# Patient Record
Sex: Female | Born: 1935 | Race: White | Hispanic: No | Marital: Married | Smoking: Never smoker
Health system: Southern US, Community
[De-identification: ages and names within clinical notes are randomized; demographics above are authoritative.]

---

## 2003-11-25 ENCOUNTER — Other Ambulatory Visit: Payer: Self-pay

## 2003-11-26 ENCOUNTER — Other Ambulatory Visit: Payer: Self-pay

## 2004-07-24 ENCOUNTER — Ambulatory Visit: Admission: RE | Admit: 2004-07-24 | Discharge: 2004-07-24 | Payer: Self-pay | Admitting: Neurosurgery

## 2004-08-04 ENCOUNTER — Inpatient Hospital Stay (HOSPITAL_COMMUNITY): Admission: RE | Admit: 2004-08-04 | Discharge: 2004-08-06 | Payer: Self-pay | Admitting: Neurosurgery

## 2005-10-01 ENCOUNTER — Other Ambulatory Visit: Payer: Self-pay

## 2005-10-01 ENCOUNTER — Emergency Department: Payer: Self-pay | Admitting: Emergency Medicine

## 2011-05-12 ENCOUNTER — Ambulatory Visit: Payer: Self-pay | Admitting: Internal Medicine

## 2012-08-03 ENCOUNTER — Ambulatory Visit: Payer: Self-pay | Admitting: Internal Medicine

## 2012-08-03 ENCOUNTER — Inpatient Hospital Stay: Payer: Self-pay | Admitting: Internal Medicine

## 2012-08-03 LAB — CBC
HCT: 35.3 % (ref 35.0–47.0)
HGB: 11.9 g/dL — ABNORMAL LOW (ref 12.0–16.0)
MCV: 89 fL (ref 80–100)
WBC: 10.5 10*3/uL (ref 3.6–11.0)

## 2012-08-03 LAB — CK TOTAL AND CKMB (NOT AT ARMC)
CK, Total: 113 U/L (ref 21–215)
CK-MB: 1.1 ng/mL (ref 0.5–3.6)
CK-MB: 2 ng/mL (ref 0.5–3.6)

## 2012-08-03 LAB — URINALYSIS, COMPLETE
Nitrite: POSITIVE
Protein: 30
Specific Gravity: 1.02 (ref 1.003–1.030)
WBC UR: 24 /HPF (ref 0–5)

## 2012-08-03 LAB — BASIC METABOLIC PANEL
Anion Gap: 9 (ref 7–16)
Calcium, Total: 8.3 mg/dL — ABNORMAL LOW (ref 8.5–10.1)
Glucose: 115 mg/dL — ABNORMAL HIGH (ref 65–99)
Osmolality: 283 (ref 275–301)
Sodium: 141 mmol/L (ref 136–145)

## 2012-08-04 LAB — BASIC METABOLIC PANEL
BUN: 12 mg/dL (ref 7–18)
Chloride: 106 mmol/L (ref 98–107)
Co2: 27 mmol/L (ref 21–32)
Creatinine: 1.07 mg/dL (ref 0.60–1.30)
EGFR (African American): 53 — ABNORMAL LOW
Glucose: 124 mg/dL — ABNORMAL HIGH (ref 65–99)

## 2012-08-04 LAB — CBC WITH DIFFERENTIAL/PLATELET
Basophil #: 0.1 10*3/uL (ref 0.0–0.1)
Eosinophil #: 0.1 10*3/uL (ref 0.0–0.7)
HCT: 34.9 % — ABNORMAL LOW (ref 35.0–47.0)
Lymphocyte #: 1.4 10*3/uL (ref 1.0–3.6)
MCHC: 34.7 g/dL (ref 32.0–36.0)
Neutrophil #: 8 10*3/uL — ABNORMAL HIGH (ref 1.4–6.5)
Platelet: 217 10*3/uL (ref 150–440)
RDW: 17.1 % — ABNORMAL HIGH (ref 11.5–14.5)

## 2012-08-04 LAB — CK TOTAL AND CKMB (NOT AT ARMC): CK-MB: 1.5 ng/mL (ref 0.5–3.6)

## 2012-08-06 LAB — URINE CULTURE

## 2012-09-10 ENCOUNTER — Inpatient Hospital Stay: Payer: Self-pay | Admitting: Internal Medicine

## 2012-09-10 LAB — CBC WITH DIFFERENTIAL/PLATELET
Basophil %: 0.2 %
Eosinophil #: 0.2 10*3/uL (ref 0.0–0.7)
HCT: 36.8 % (ref 35.0–47.0)
HGB: 12.5 g/dL (ref 12.0–16.0)
MCH: 29.4 pg (ref 26.0–34.0)
MCHC: 33.8 g/dL (ref 32.0–36.0)
Monocyte #: 1.2 x10 3/mm — ABNORMAL HIGH (ref 0.2–0.9)
Neutrophil %: 77.9 %
RBC: 4.23 10*6/uL (ref 3.80–5.20)

## 2012-09-10 LAB — COMPREHENSIVE METABOLIC PANEL
Albumin: 2.9 g/dL — ABNORMAL LOW (ref 3.4–5.0)
Anion Gap: 9 (ref 7–16)
Calcium, Total: 8.2 mg/dL — ABNORMAL LOW (ref 8.5–10.1)
Chloride: 102 mmol/L (ref 98–107)
Co2: 29 mmol/L (ref 21–32)
EGFR (African American): 50 — ABNORMAL LOW
Osmolality: 280 (ref 275–301)
Potassium: 3.4 mmol/L — ABNORMAL LOW (ref 3.5–5.1)
Sodium: 140 mmol/L (ref 136–145)

## 2012-09-10 LAB — URINALYSIS, COMPLETE
Nitrite: NEGATIVE
Protein: NEGATIVE
Specific Gravity: 1.014 (ref 1.003–1.030)
WBC UR: 209 /HPF (ref 0–5)

## 2012-09-11 LAB — CBC WITH DIFFERENTIAL/PLATELET
Basophil #: 0.1 10*3/uL (ref 0.0–0.1)
Eosinophil #: 0.3 10*3/uL (ref 0.0–0.7)
HCT: 31.5 % — ABNORMAL LOW (ref 35.0–47.0)
Lymphocyte %: 16 %
Monocyte %: 8.7 %
Neutrophil #: 8.3 10*3/uL — ABNORMAL HIGH (ref 1.4–6.5)
RDW: 17.5 % — ABNORMAL HIGH (ref 11.5–14.5)
WBC: 11.5 10*3/uL — ABNORMAL HIGH (ref 3.6–11.0)

## 2012-09-11 LAB — BASIC METABOLIC PANEL
Anion Gap: 11 (ref 7–16)
Calcium, Total: 7.9 mg/dL — ABNORMAL LOW (ref 8.5–10.1)
Co2: 25 mmol/L (ref 21–32)
Creatinine: 1.03 mg/dL (ref 0.60–1.30)
EGFR (African American): 55 — ABNORMAL LOW

## 2012-09-12 LAB — STOOL CULTURE

## 2012-09-16 LAB — CULTURE, BLOOD (SINGLE)

## 2014-06-05 ENCOUNTER — Emergency Department: Payer: Self-pay | Admitting: Emergency Medicine

## 2014-06-05 LAB — COMPREHENSIVE METABOLIC PANEL
ALK PHOS: 103 U/L
AST: 14 U/L — AB (ref 15–37)
Albumin: 3.4 g/dL (ref 3.4–5.0)
Anion Gap: 7 (ref 7–16)
BILIRUBIN TOTAL: 0.5 mg/dL (ref 0.2–1.0)
BUN: 11 mg/dL (ref 7–18)
CALCIUM: 8.7 mg/dL (ref 8.5–10.1)
Chloride: 100 mmol/L (ref 98–107)
Co2: 31 mmol/L (ref 21–32)
Creatinine: 1.06 mg/dL (ref 0.60–1.30)
EGFR (Non-African Amer.): 46 — ABNORMAL LOW
GFR CALC AF AMER: 53 — AB
Glucose: 116 mg/dL — ABNORMAL HIGH (ref 65–99)
Osmolality: 276 (ref 275–301)
Potassium: 2.9 mmol/L — ABNORMAL LOW (ref 3.5–5.1)
SGPT (ALT): 19 U/L
SODIUM: 138 mmol/L (ref 136–145)
TOTAL PROTEIN: 8.2 g/dL (ref 6.4–8.2)

## 2014-06-05 LAB — CBC
HCT: 39.2 % (ref 35.0–47.0)
HGB: 12.9 g/dL (ref 12.0–16.0)
MCH: 29.9 pg (ref 26.0–34.0)
MCHC: 32.9 g/dL (ref 32.0–36.0)
MCV: 91 fL (ref 80–100)
Platelet: 263 10*3/uL (ref 150–440)
RBC: 4.32 10*6/uL (ref 3.80–5.20)
RDW: 15.5 % — AB (ref 11.5–14.5)
WBC: 8.3 10*3/uL (ref 3.6–11.0)

## 2015-03-04 NOTE — Discharge Summary (Signed)
PATIENT NAME:  Kristina [name], Kristina [name] MR#:  [id number] DATE OF BIRTH:  [birth year]  DATE OF ADMISSION:  08/03/2012 DATE OF DISCHARGE:  08/07/2012  PRIMARY CARE PHYSICIAN: Barry Brunner, MD  DISCHARGE DIAGNOSES:  1. Intracranial hemorrhage. 2. Cervical pedicle blastic lesion. 3. Urinary tract infection. 4. Hypertension.  5. Atrial fibrillation.  HISTORY AND PRESENTATION: This is a 81 year old female with past history of cerebrovascular accident and aphagia for almost seven years, atrial fibrillation but not taking any anticoagulation, who lives at home with her husband and she was brought to the Emergency Room after having a fall. On further work-up in the ER, she was found having scalp laceration and there was a small questionable hemorrhagic lesion in interhemispheric fissure anteriorly and so she was admitted to the medical floor for monitoring.   HOSPITAL STAY AND PROGRESS: During the hospital stay, because of her age, palliative care consultation was done to determine further treatment plan and as per the family they decided to monitor the bleeding rather than going for any extensive work up or transferring her to a tertiary care center. At the time of the meeting of palliative care with the family they were not sure about their decision and also about the code status. They chose at that time to keep her FULL CODE. During the hospital stay, the next day, a repeat CT of the head was done which showed the same stable finding and no increase in the hemorrhage size or any midline shift. Her neuro checks also remained stable. She remained at her baseline state as before. Other significant findings during the hospital stay and ER were found on CT scan of the cervical spine which was done to rule out any fracture. They have found severe cervical spine degenerative disease and right C3 pedicle blastic lesion and it could be a blastic metastatic focus, as per the radiologist, but no further work-up was done at that  time because of her age and her other medical problems. If the family decides to do any further work-up for that, she can be followed by her primary medical doctor for this issue and they can do further work-up as needed. In the hospital she was monitored with regular neuro checks and without any antiplatelet or anticoagulation medications. She remained stable and because of her age and multiple medical issues we finally arranged for transferring her to a rehab center. Other medical issues with her which we addressed during the hospital stay were urinary tract infection. She was found having urinary tract infection on admission. Urine culture was also done and the bacteria was sensitive to all routine antibiotics. She was given five days of ceftriaxone IV antibiotic, so she finished it and she does not need any antibiotic on discharge. For history of atrial fibrillation, she was taking Cardizem. We continued it and she remained stable. No anticoagulation due to her age, history of fall and intracranial hemorrhage. She also has history of hypertension which remained stable with Cardizem and we needed to Carvedilol in between but we are discharging her without that, just continuing her Lasix, what she was taking at home.   CONDITION ON DISCHARGE: Satisfactory.   CODE STATUS: FULL CODE.   DISCHARGE MEDICATIONS: 1. Diltiazem 180 mg extended release one capsule twice a day. 2. Furosemide 80 mg tablet once a day. 3. Senna 8.6 mg oral tablet twice a day as needed for constipation.  4. Pantoprazole 40 mg delayed-release tablet orally once a day.  NOTE: She was advised to stop taking  aspirin as she has intracranial hemorrhage and do not give any antiplatelet agent or anticoagulation until further follow-up with neurologist or CT of the head with confirmation of resolution of the hemorrhage is done, which she is advised to go for within a week.   DIET: Consistency mechanical soft and we advised her to be under  constant supervision and help with feeding.   REFERRALS: As mentioned above, with neurology clinic with CT of head done within 1 to 2 weeks. Her PCP is Dr[facility] Barry BrunnerGlenn Willett[facility] ____________________________ Hope PigeonVaibhavkumar G. Elisabeth PigeonVachhani, MD vgv:slb D: 08/07/2012 15:32:38 ET     T: 08/07/2012 15:53:05 ET        JOB#: [id number] cc: Hope PigeonVaibhavkumar G. Elisabeth PigeonVachhani, MD, <Dictator> Jorje GuildGlenn R. Beckey DowningWillett, MD Altamese DillingVAIBHAVKUMAR Gaither Biehn MD ELECTRONICALLY SIGNED 08/08/2012 15:31

## 2015-03-04 NOTE — H&P (Signed)
PATIENT NAME:  Kristina [name], Kristina [name] MR#:  [id number] DATE OF BIRTH:  Dec 05, 1919  DATE OF ADMISSION:  09/11/2012  PRIMARY CARE PHYSICIAN: Barry Brunner, MD   HISTORY OF PRESENT ILLNESS: Ms. Flavell is a nice 81 year old female with history of atrial fibrillation with RVR, dementia, previous urinary tract infections, cerebrovascular disease with history of TIAs, hypertension, and hyperlipidemia. She has a history of past intracerebral bleeding due to treatment with antiplatelet therapy. The patient has been under the care of a caregiver for a long time and she lives in the company of her husband who is also frail and has multiple medical conditions. The son lives next door and he is the one managing her medical decisions and overseeing her care. The patient does not talk. She is actually severely demented and her caregiver is the one who gives me most of the information during this interview. The patient has been found to have significant profuse diarrhea, watery, with occasional streaks of blood for the past three days. She probably had around 10 bowel movements within the past 24 hours. She has been able to eat. She has been able to drink and tolerate fluids. There is no nausea or vomiting. The patient still got dehydrated due to the profuse loss of fluid. She has been taking her home medications and there might be some problems with absorption due to this diarrhea. She has been brought to the ER due to the fact that she was very tachycardic with heart rates in the 130's to 140's. She received 5 mg of diltiazem IV and IV fluid boluses to control her dehydration and now her heart rate has been coming down to 110 and occasionally to 120 but overall she is stable.   I had a long conversation with the caregiver and the son. The son insists that the patient has to be a FULL CODE despite the fact of slim chances of survival after a major cardiovascular event and her decreased quality of life with immobility.   REVIEW OF  SYSTEMS: Unable to obtain due to severe dementia and the patient not communicating.    PAST MEDICAL HISTORY:  1. Atrial fibrillation, chronic, no longer on anticoagulation.  2. Intracranial hemorrhage with cervical pedicle blastic lesion.  3. History of urinary tract infections.  4. Hypertension.  5. Severe dementia.  6. Unknown history of congestive heart failure, although the patient takes 80 mg of Lasix daily. The family is not able to tell me if she does or does not have CHF.   PAST SURGICAL HISTORY: The patient's caregiver denies knowing any previous history of surgeries.   ALLERGIES: No known drug allergies.   SOCIAL HISTORY: The patient lives with her husband next door to her son. She has a 24-hour/7 days a week caregiver. She does not smoke. She does not drink.   FAMILY HISTORY: Unknown due to severe dementia.   MEDICATIONS:  1. Senokot twice daily.  2. Protonix 40 mg once daily.  3. Furosemide 80 mg once daily.  4. Diltiazem 180 extended release twice daily.  5. Digoxin 75 mcg p.o. daily.   PHYSICAL EXAMINATION:   VITAL SIGNS: Blood pressure 115/77, heart rate right now around 110, respirations 18, temperature 99.8, oxygen sats 94% on room air.  GENERAL: The patient is comfortable in bed. She does not talk. She only smiles. She cannot answer yes or no questions. She is hemodynamically stable at this moment.   HEENT: Her pupils are equal and reactive. Extraocular movements are intact. Mucosa is  moist, although very dry at admission. No oral lesions. No thrush. Anicteric sclerae. Pink conjunctivae.   NECK: Supple. No JVD. No thyromegaly. No adenopathy. No carotid bruits. No palpable masses.   CARDIOVASCULAR: Irregularly irregular, tachycardic. No significant murmurs, rubs, or gallops are auscultated. No thrills. No displacement of PMI. Note the chest x-ray from previous admission had significant cardiomegaly.   LUNGS: The patient has some crackles at the level of the left  base but not on the right. No wheezing. No stridor. There is no use of accessory respiratory muscles. No dullness to percussion.  ABDOMEN: Soft, nontender, nondistended. No hepatosplenomegaly. No masses. Bowel sounds are positive.   EXTREMITIES: No edema, no cyanosis, no clubbing. Pulses +2. Deep tendon reflexes +1. Sensation unable to assess due to the patient's lack of communication.   LYMPHATICS: Negative for lymphadenopathy on neck, supraclavicular, and epitrochlear.   MUSCULOSKELETAL: Negative for significant joint effusions or deformities.   SKIN: Without any significant rashes or petechiae. Decreased turgor. Capillary refill is about 3 to 4 seconds. Very dry skin.   PSYCH: Mood unable to assess due to lack of communication and significant dementia.   NEUROLOGIC: Strength seems to be equal in four extremities.   RESULTS: Glucose 106, creatinine 1.12, sodium 140, potassium 3.4, GFR around 40. White blood count is elevated at 14,000, hemoglobin 12.5, platelets 231. Urinalysis has increased white blood cells to 209. Positive for leukocyte esterase, negative nitrites. Previous urine cultures have cultured Klebsiella pneumoniae and Proteus mirabilis that were sensitive to ciprofloxacin and ceftriaxone.   EKG atrial fibrillation with RVR, right bundle branch block.   Troponin 0.02.   ASSESSMENT AND PLAN: This is a 81 year old female with history of atrial fibrillation and dementia with history of three days of profuse diarrhea, dehydration, and atrial fibrillation with RVR. 1. Atrial fibrillation with RVR. The patient has received 5 mg IV Cardizem. She has slowed down her heart rate from the 130's to 140's now in the 110's and occasionally 120's. She has several reasons to be in RVR. Number one would be dehydration, number two urinary tract infection, number three probably not absorbing well her medications, number four electrolyte deficiencies. For that reason we are going to correct all  these issues. She is going to get some potassium IV. She is going to continue to get IV fluids to correct her dehydration and she is going to be treated with ciprofloxacin for her urinary tract infection.  2. The patient is on digoxin. I am going to get digoxin levels and hopefully we can increase the doses if the patient tolerates it. Her blood pressure is 110's systolic for what I don't want to increase some of her medications. The patient could have p.r.n. doses of beta-blocker, metoprolol, if necessary to control her heart rate if continues to be sustained above 120. I could not find an echocardiogram on the chart, although at this moment I will defer it.  3. Dehydration. The patient has history of diarrhea for three days. She has been taking Lasix and that medication has been given to her the last couple of days adding on to the dehydration for what I am going to hold the Lasix. I am going to give her fluids gently. I am not quite sure if the patient has history of CHF. The patient's family cannot give me an idea though I know she has never had pulmonary edema for what will reconsider and start on Lasix by tomorrow.  4. Significant dementia with the patient noncommunicating. Continue  just general measures.  5. Hypokalemia. Replete.  6. Urinary tract infection. Add on Cipro. Urine cultures and blood cultures have been taken.  7. Diarrhea. Rule out the possibility of Clostridium difficile with a Clostridium difficile toxin. The patient received one dose of metronidazole. I am going to continue just ciprofloxacin for treatment of urinary tract infection. If toxin is positive, will add on metronidazole.  8. History of intracranial bleeding. The patient is not to be on DVT prophylaxis other than mechanical measurements. The patient no longer is on aspirin and she is not anticoagulated for her atrial fibrillation.  9. Hypertension. Continue treatment with diltiazem.  10. Other medical problems seem to be  stable at this moment. She is going to be admitted for treatment of this situation.  11. CODE STATUS. The patient is a FULL CODE.  12. GI prophylaxis. PPI.   TIME SPENT WITH THE PATIENT AND FAMILY: About 45 minutes.   ____________________________ Felipa Furnaceoberto Sanchez Gutierrez, MD rsg:drc D: 09/10/2012 20:54:56 ET T: 09/11/2012 06:01:49 ET JOB#: [id number]  cc: Felipa Furnaceoberto Sanchez Gutierrez, MD, <Dictator> Jorje GuildGlenn R. Beckey DowningWillett, MD Regan RakersOBERTO Juanda ChanceSANCHEZ GUTIERRE MD ELECTRONICALLY SIGNED 09/11/2012 14:37

## 2015-03-04 NOTE — H&P (Signed)
PATIENT NAME:  Kristina [name], [name] MR#:  [id number] DATE OF BIRTH:  [birth year]  DATE OF ADMISSION:  08/03/2012  ADMITTING PHYSICIAN: Dr. Enid Baasadhika Keyarah Mcroy  PRIMARY CARE PHYSICIAN: Dr. Barry BrunnerGlenn Willett   CHIEF COMPLAINT: Fall.   HISTORY OF PRESENT ILLNESS: Ms. Kristina ChuteShore is a  81 year old elderly Caucasian female with past medical history significant for history of cerebrovascular accident, aphasia for almost seven years, history of atrial fibrillation currently not on any anticoagulation, who lives at home with her husband and was brought in after she had a fall. The patient is completely aphasic at this time and most of my history is obtained from the ER physician, Dr. Governor Rooksebecca Lord, who was able to speak with the patient's husband and son at the bedside., Unfortunately, they are currently not in the hospital and I have tried their phones and could not reach them.  It seems that the patient's husband helps her at home around with a walker and she was found later, seen fallen on the floor with hitting her head. There is also a scalp laceration on the posterior left side. No history of prior falls. She does have a history of atrial fibrillation and at one point she was on Coumadin about nine years ago when she was admitted to the hospital here. At that time it seems like she was verbal and was still working in [employer].  Also of note in the history it mentions she does have some dementia along with aphasia.  In the ER she did have a CT of the head which shows subtle hemorrhage in the interhemispheric fissure anteriorly.  She also has a urinary tract infection. Her CT of the C-spine showed C3 pedicle blastic lesion, which could be metastatic at this time.   PAST MEDICAL HISTORY:  1. Dementia.  2. History of cerebrovascular accident.  Nonverbal for several years.  3. Atrial fibrillation.   PAST SURGICAL HISTORY: Not known secondary to the patient's dementia.   ALLERGIES: No known drug allergies.   CURRENT HOME  MEDICATIONS:  1. Diltiazem 180 mg p.o. b.i.d.  2. Furosemide 80 mg p.o. daily.  3. Aspirin 81 mg p.o. daily.   SOCIAL HISTORY: Lives at home with her husband. No smoking or alcohol use.   FAMILY HISTORY:  Not known.  REVIEW OF SYSTEMS: Difficult to obtain as the patient is nonverbal at baseline and also has dementia.   PHYSICAL EXAMINATION:  VITAL SIGNS: Temperature 98.6 degrees Fahrenheit, pulse 97, respirations 20, blood pressure 123/76, pulse oximetry 93% on room air.   GENERAL: Well-built, well -nourished elderly female lying in bed, pleasant looking with a smile on her face, not in any acute distress.   HEENT: Normocephalic. There is a linear laceration on the back of her head on the left side that has been stapled and still has some blood oozing from the site. Her pupils are equal, round, reacting to light. Anicteric sclerae. Extraocular movements intact. Oropharynx clear without erythema, mass, or exudates. Poor dentition noted.   NECK: Supple. No thyromegaly, JVD, or carotid bruits. No lymphadenopathy.   LUNGS: She is moving air bilaterally. No wheeze or crackles. No use of accessory muscles for breathing. Decreased bibasilar breath sounds due to poor inspiratory effort.   CARDIOVASCULAR: S1 and S2. Irregular rhythm and rapid rate. No murmurs, rubs, or gallops.   ABDOMEN: Soft, nontender, nondistended. No hepatosplenomegaly. Normal bowel sounds.   EXTREMITIES: No pedal edema. No clubbing or cyanosis.  Feeble dorsalis pedis pulses palpable bilaterally. She is holding her right  upper extremity in flexion, guarded position.  SKIN: No acne, rash, or lesions other than the laceration noted on the skull.    NEUROLOGIC: The patient is awake, alert. She is following some simple commands. She is nonverbal. She probably has right upper extremity weakness, 2/5 at this time. I am not sure if it is from a stroke or  if she has any sustained injury and holding it in guarded position. Unable  to know at this time. Right lower extremity has 4/5 strength and left upper and lower extremities seem to be 5/5 and at her baseline. Again, she is nonverbal. No obvious facial droop or other cranial nerve deficits noted on exam.   PSYCHOLOGICAL: The patient seems alert and she is nonverbal.  Mood seems pleasant.   LABORATORY DATA: WBC 10.5, hemoglobin 11.9, hematocrit 35.3, platelet count 221. Sodium 141, potassium 3.3, chloride 105, bicarbonate 27, BUN 16, creatinine 1.19, glucose 115, calcium 8.3, troponin less than 0.02. CT of the head is showing subtle density noted in the interhemispheric fissure anteriorly.  Subtle hemorrhage cannot be excluded. There is no mass affect is seen. Mucus retention cyst left maxillary sinus. CT of the C-spine without contrast showing severe cervical spine degenerative disease. No evidence of fracture-dislocation. Right C3 pedicle blastic lesion. This could be blastic metastatic focus. Urinalysis is nitrite positive,  1+  leukocyte esterase, 3+ bacteria, and 24 WBCs. EKG showing sinus arrhythmia, heart rate 86, biatrial enlargement,  right ventricular hypertrophy is present. There is sagging ST depression noted in V3, V4. Anteroseptal injury cannot be excluded.   ASSESSMENT AND PLAN: 81 year old elderly female with dementia, nonverbal at baseline, atrial fibrillation, brought to the hospital after she had a fall and CT head is showing subtle intracranial hemorrhage.  1. Fall and subtle intracranial hemorrhage in the anterior interhemispheric fissure.  I could not reach the family as they have left the Emergency Room right now and I was not able to reach them on the phone, but as per Dr. Merlene Pulling verbal information the family did not want her to be transferred in spite of explaining the risk of expansion of the intracranial hemorrhage. It seems like the family did not want any aggressive measures.  However, there is no documentation of her CODE STATUS at this time. I will try  to reach the family again. We will admit her here and continue to closely monitor her with repeat CT in 24 hours and neuro checks.  The patient is alert, nonverbal, and still following some commands. As explained in the neuro exam above, there is some discrepancy between right- and left-sided exams and we will have to verify if this is old or new. We will avoid any antiplatelet agents or anticoagulants because of the intracranial bleed.  2. Urinary tract infection. Urine and blood cultures have been ordered and started on IV Rocephin.  3. Hypokalemia is being replaced and we will recheck.  4. Atrial fibrillation. Continue Cardizem.  5. C3 lesion noted on cervical spine, possible blastic lesion, questionable metastatic. Again, we will need to get in touch with the family if this is old or new, and if new we will have to see how aggressively they want to pursue it.  We will get palliative care involved early here.  6. She might benefit from having Hospice follow her at home.  7. GI and deep venous thrombosis prophylaxis. Protonix and TEDs.  CODE STATUS at this time is not known. We will need to discuss with the family as  they have expressed possible DO NOT RESUSCITATE status to the ER physician.   TIME SPENT ON ADMISSION: 50 minutes.   ____________________________ Enid Baas, MD rk:bjt D: 08/03/2012 10:46:47 ET T: 08/03/2012 12:01:32 ET JOB#: [id number]  cc: Enid Baas, MD, <Dictator> Jorje Guild. Beckey Downing, MD Enid Baas MD ELECTRONICALLY SIGNED 08/04/2012 17:05

## 2015-03-04 NOTE — Discharge Summary (Signed)
PATIENT NAME:  Kristina [name], Kristina H MR#:  [id number] DATE OF BIRTH:  10-22-1920  DATE OF ADMISSION:  09/10/2012 DATE OF DISCHARGE:  09/12/2012  PRIMARY CARE PHYSICIAN: Sherrine MaplesGlenn R. Beckey DowningWillett, MD   DISCHARGE DIAGNOSES:  1. Atrial fibrillation with rapid ventricular response.  2. Urinary tract infection. 3. Diarrhea.  4. Dehydration.   CODE STATUS:  FULL CODE.     CONDITION: Stable.   HOME MEDICATIONS:  1. Diltiazem 180 mg p.o. b.i.d.  2. Lasix 80 mg p.o. once daily.  3. Pantoprazole 40 mg p.o. daily.  4. Digoxin 50 mcg/mL, 1.5 mL p.o. daily.  5. Cipro 500 mg p.o. b.i.d. for 3 days.   NOTE: Stop Senokot 8.6 mg p.o. b.i.d.   DIET: Low sodium diet.   ACTIVITY: As tolerated.   FOLLOW-UP CARE:  1. Follow up with PCP within 1 to 2 weeks.  2. Resume Home Health service.   REASON FOR ADMISSION: Significant profuse diarrhea.   HISTORY OF PRESENT ILLNESS: The patient is a 81 year old Caucasian female with a history of atrial fibrillation, dementia, urinary tract infection, cerebrovascular accident, hypertension, hyperlipidemia, was brought to the ED due to diarrhea which is watery, profuse, occasional streaks blood for the past three days. The patient had 10 bowel movements within 24 hours. There was no nausea or vomiting. She was brought to the ED and was noted to have tachycardia at 130 to 140. She received 5 mg of Diltiazem IV and an IV fluid bolus for dehydration and was admitted for atrial fibrillation with rapid ventricular response and diarrhea.   LABORATORY, DIAGNOSTIC AND RADIOLOGICAL DATA: On admission date, the patient's glucose was 106, creatinine 1.12, sodium 140, potassium 3.4. White count was 14.0. Urinalysis showed WBC at 209.   HOSPITAL COURSE: (Problem List)  1. Atrial fibrillation with rapid ventricular response: Possibly triggered by diarrhea and urinary tract infection. After admission, the patient has been treated with Diltiazem and Digoxin.  In addition, we gave potassium,  magnesium supplements.  After the above-mentioned treatment, the patient's heart rate has been controlled.  2. Dehydration: The patient has been treated with IV fluid support. Dehydration has improved.  3. Urinary tract infection and leukocytosis: The patient has been treated with Rocephin.  White count decreased from 14 to 11.5.  4. Diarrhea: The patient had no diarrhea  after admission.  The diarrhea is possibly due to constipation medications, so we will stop constipation medications after discharge. Stool Clostridium difficile and culture are negative.   The patient is nonverbal but in no distress. No diarrhea so far. The itching is good. The patient is clinically stable and will be discharged to home today. I discussed the patient's discharge plan with the patient's Home Health staff and the case manager.   TIME SPENT: About 33 minutes.  ____________________________ Shaune PollackQing Vashon Riordan, MD qc:cbb D: 09/12/2012 14:25:43 ET T: 09/13/2012 12:28:07 ET JOB#: [id number]  cc: Shaune PollackQing Roselani Grajeda, MD, <Dictator> Jorje GuildGlenn R. Beckey DowningWillett, MD Shaune PollackQING Makela Niehoff MD ELECTRONICALLY SIGNED 09/13/2012 14:21

## 2015-08-16 ENCOUNTER — Other Ambulatory Visit: Payer: Self-pay

## 2015-08-16 ENCOUNTER — Inpatient Hospital Stay
Admission: EM | Admit: 2015-08-16 | Discharge: 2015-08-21 | DRG: 308 | Disposition: A | Discharge status: Home / Self Care | Payer: Medicare Other | Attending: Internal Medicine | Admitting: Internal Medicine

## 2015-08-16 ENCOUNTER — Emergency Department: Payer: Medicare Other

## 2015-08-16 DIAGNOSIS — I509 Heart failure, unspecified: Secondary | ICD-10-CM

## 2015-08-16 DIAGNOSIS — D72829 Elevated white blood cell count, unspecified: Secondary | ICD-10-CM

## 2015-08-16 DIAGNOSIS — I2699 Other pulmonary embolism without acute cor pulmonale: Secondary | ICD-10-CM

## 2015-08-16 DIAGNOSIS — R627 Adult failure to thrive: Secondary | ICD-10-CM | POA: Diagnosis present

## 2015-08-16 DIAGNOSIS — I4891 Unspecified atrial fibrillation: Principal | ICD-10-CM | POA: Diagnosis present

## 2015-08-16 DIAGNOSIS — D649 Anemia, unspecified: Secondary | ICD-10-CM | POA: Diagnosis present

## 2015-08-16 DIAGNOSIS — N390 Urinary tract infection, site not specified: Secondary | ICD-10-CM

## 2015-08-16 DIAGNOSIS — J810 Acute pulmonary edema: Secondary | ICD-10-CM

## 2015-08-16 DIAGNOSIS — E872 Acidosis, unspecified: Secondary | ICD-10-CM

## 2015-08-16 DIAGNOSIS — L8990 Pressure ulcer of unspecified site, unspecified stage: Secondary | ICD-10-CM

## 2015-08-16 DIAGNOSIS — E876 Hypokalemia: Secondary | ICD-10-CM | POA: Diagnosis present

## 2015-08-16 DIAGNOSIS — G9341 Metabolic encephalopathy: Secondary | ICD-10-CM

## 2015-08-16 DIAGNOSIS — J690 Pneumonitis due to inhalation of food and vomit: Secondary | ICD-10-CM | POA: Diagnosis present

## 2015-08-16 DIAGNOSIS — R532 Functional quadriplegia: Secondary | ICD-10-CM | POA: Diagnosis present

## 2015-08-16 DIAGNOSIS — I69320 Aphasia following cerebral infarction: Secondary | ICD-10-CM | POA: Diagnosis not present

## 2015-08-16 DIAGNOSIS — B965 Pseudomonas (aeruginosa) (mallei) (pseudomallei) as the cause of diseases classified elsewhere: Secondary | ICD-10-CM | POA: Diagnosis present

## 2015-08-16 DIAGNOSIS — L89211 Pressure ulcer of right hip, stage 1: Secondary | ICD-10-CM | POA: Diagnosis present

## 2015-08-16 DIAGNOSIS — R531 Weakness: Secondary | ICD-10-CM | POA: Diagnosis present

## 2015-08-16 DIAGNOSIS — R1310 Dysphagia, unspecified: Secondary | ICD-10-CM | POA: Diagnosis present

## 2015-08-16 DIAGNOSIS — Z66 Do not resuscitate: Secondary | ICD-10-CM | POA: Diagnosis present

## 2015-08-16 DIAGNOSIS — L89221 Pressure ulcer of left hip, stage 1: Secondary | ICD-10-CM | POA: Diagnosis present

## 2015-08-16 DIAGNOSIS — F0390 Unspecified dementia without behavioral disturbance: Secondary | ICD-10-CM | POA: Diagnosis present

## 2015-08-16 DIAGNOSIS — R000 Tachycardia, unspecified: Secondary | ICD-10-CM

## 2015-08-16 DIAGNOSIS — I110 Hypertensive heart disease with heart failure: Secondary | ICD-10-CM | POA: Diagnosis present

## 2015-08-16 DIAGNOSIS — E870 Hyperosmolality and hypernatremia: Secondary | ICD-10-CM | POA: Diagnosis present

## 2015-08-16 LAB — TROPONIN I

## 2015-08-16 LAB — URINALYSIS COMPLETE WITH MICROSCOPIC (ARMC ONLY)
BILIRUBIN URINE: NEGATIVE
GLUCOSE, UA: NEGATIVE mg/dL
Ketones, ur: NEGATIVE mg/dL
NITRITE: NEGATIVE
PH: 5 (ref 5.0–8.0)
Protein, ur: 30 mg/dL — AB
Specific Gravity, Urine: 1.018 (ref 1.005–1.030)
Squamous Epithelial / LPF: NONE SEEN

## 2015-08-16 LAB — COMPREHENSIVE METABOLIC PANEL
ALT: 25 U/L (ref 14–54)
ANION GAP: 8 (ref 5–15)
AST: 32 U/L (ref 15–41)
Albumin: 3.3 g/dL — ABNORMAL LOW (ref 3.5–5.0)
Alkaline Phosphatase: 82 U/L (ref 38–126)
BILIRUBIN TOTAL: 1.1 mg/dL (ref 0.3–1.2)
BUN: 22 mg/dL — ABNORMAL HIGH (ref 6–20)
CHLORIDE: 108 mmol/L (ref 101–111)
CO2: 27 mmol/L (ref 22–32)
Calcium: 8.8 mg/dL — ABNORMAL LOW (ref 8.9–10.3)
Creatinine, Ser: 0.97 mg/dL (ref 0.44–1.00)
GFR, EST AFRICAN AMERICAN: 57 mL/min — AB (ref >60)
GFR, EST NON AFRICAN AMERICAN: 49 mL/min — AB (ref >60)
Glucose, Bld: 134 mg/dL — ABNORMAL HIGH (ref 65–99)
POTASSIUM: 4 mmol/L (ref 3.5–5.1)
Sodium: 143 mmol/L (ref 135–145)
TOTAL PROTEIN: 7.1 g/dL (ref 6.5–8.1)

## 2015-08-16 LAB — CBC WITH DIFFERENTIAL/PLATELET
Basophils Absolute: 0.1 10*3/uL (ref 0–0.1)
Basophils Relative: 1 %
EOS ABS: 0 10*3/uL (ref 0–0.7)
EOS PCT: 0 %
HCT: 41.1 % (ref 35.0–47.0)
Hemoglobin: 13.4 g/dL (ref 12.0–16.0)
LYMPHS ABS: 1.3 10*3/uL (ref 1.0–3.6)
LYMPHS PCT: 12 %
MCH: 29.3 pg (ref 26.0–34.0)
MCHC: 32.7 g/dL (ref 32.0–36.0)
MCV: 89.6 fL (ref 80.0–100.0)
MONO ABS: 0.9 10*3/uL (ref 0.2–0.9)
Monocytes Relative: 8 %
Neutro Abs: 8.8 10*3/uL — ABNORMAL HIGH (ref 1.4–6.5)
Neutrophils Relative %: 79 %
PLATELETS: 241 10*3/uL (ref 150–440)
RBC: 4.59 MIL/uL (ref 3.80–5.20)
RDW: 15.9 % — AB (ref 11.5–14.5)
WBC: 11.1 10*3/uL — AB (ref 3.6–11.0)

## 2015-08-16 LAB — MAGNESIUM: MAGNESIUM: 2.6 mg/dL — AB (ref 1.7–2.4)

## 2015-08-16 LAB — LACTIC ACID, PLASMA
LACTIC ACID, VENOUS: 2.1 mmol/L — AB (ref 0.5–2.0)
Lactic Acid, Venous: 1.8 mmol/L (ref 0.5–2.0)

## 2015-08-16 LAB — PROTIME-INR
INR: 1.11
Prothrombin Time: 14.5 seconds (ref 11.4–15.0)

## 2015-08-16 LAB — MRSA PCR SCREENING: MRSA by PCR: NEGATIVE

## 2015-08-16 LAB — TSH: TSH: 2.014 u[IU]/mL (ref 0.350–4.500)

## 2015-08-16 LAB — DIGOXIN LEVEL

## 2015-08-16 MED ORDER — DILTIAZEM HCL 25 MG/5ML IV SOLN
INTRAVENOUS | Status: AC
  Administered 2015-08-16: 10 mg via INTRAVENOUS
  Filled 2015-08-16: qty 5

## 2015-08-16 MED ORDER — CETYLPYRIDINIUM CHLORIDE 0.05 % MT LIQD
7.0000 mL | Freq: Two times a day (BID) | OROMUCOSAL | Status: DC
  Administered 2015-08-17 – 2015-08-21 (×8): 7 mL via OROMUCOSAL

## 2015-08-16 MED ORDER — SODIUM CHLORIDE 0.9 % IV SOLN
INTRAVENOUS | Status: DC
  Administered 2015-08-16 – 2015-08-17 (×2): via INTRAVENOUS
  Administered 2015-08-17: 75 mL/h via INTRAVENOUS
  Administered 2015-08-18 – 2015-08-20 (×4): via INTRAVENOUS

## 2015-08-16 MED ORDER — ACETAMINOPHEN 650 MG RE SUPP: 650.0000 mg | Freq: Four times a day (QID) | RECTAL | Status: DC | PRN

## 2015-08-16 MED ORDER — ACETAMINOPHEN 325 MG PO TABS: 650.0000 mg | ORAL_TABLET | Freq: Four times a day (QID) | ORAL | Status: DC | PRN

## 2015-08-16 MED ORDER — DILTIAZEM HCL 25 MG/5ML IV SOLN
10.0000 mg | Freq: Once | INTRAVENOUS | Status: AC
  Administered 2015-08-16: 10 mg via INTRAVENOUS

## 2015-08-16 MED ORDER — SODIUM CHLORIDE 0.9 % IV BOLUS (SEPSIS)
1000.0000 mL | Freq: Once | INTRAVENOUS | Status: AC
  Administered 2015-08-16: 1000 mL via INTRAVENOUS

## 2015-08-16 MED ORDER — CHLORHEXIDINE GLUCONATE 0.12 % MT SOLN
15.0000 mL | Freq: Two times a day (BID) | OROMUCOSAL | Status: DC
  Administered 2015-08-16 – 2015-08-21 (×8): 15 mL via OROMUCOSAL
  Filled 2015-08-16 (×4): qty 15

## 2015-08-16 MED ORDER — DEXTROSE 5 % IV SOLN
5.0000 mg/h | INTRAVENOUS | Status: DC
  Administered 2015-08-16 – 2015-08-18 (×3): 5 mg/h via INTRAVENOUS
  Administered 2015-08-18: 10 mg/h via INTRAVENOUS
  Administered 2015-08-19: 5 mg/h via INTRAVENOUS
  Filled 2015-08-16 (×5): qty 100

## 2015-08-16 MED ORDER — DILTIAZEM LOAD VIA INFUSION
10.0000 mg | Freq: Once | INTRAVENOUS | Status: DC
  Filled 2015-08-16: qty 10

## 2015-08-16 NOTE — ED Notes (Signed)
BIB EMS. EMS states per home health nurse pt hasn't eaten in 3 days. While trying to feed her today, pt choked on Jello. EMS states home health nurse said she "turned blue, but came back." Hx Stroke - nonverbal since. Suction attempted by EMS with slight clear drainage.

## 2015-08-16 NOTE — ED Provider Notes (Addendum)
[facility] Emergency Department Provider Note  ____________________________________________   I have reviewed the triage vital signs and the nursing notes. History is limited secondary to patient baseline mental status and aphasic state  HISTORY  Chief Complaint Choking    HPI Kristina [name] is a 81 y.o. female who is  aphasic at baselinepresents today from EMS. She comes her home where she has to wait for nursing. She presents with her son who brings power of attorney paperwork. He is the power of attorney. He states the patient is essentially DO NOT RESUSCITATE. He does not wish intubation he does not wish heroic measures such as CPR. He is okay however if we give medications to try to improve her condition. According to him she has not had much to eat or drink the last few days. He has not been with her the entire time and has been home health. According to EMS, home health and the patient's POA reports that this morning she choked on her Jell-O. This has happened before. He is unsure of exactly what medications she is on however is indicated that she did not take any this morning. There is been no significant change in mental status prior to this. The patient herself cannot give a history.  No past medical history on file.  There are no active problems to display for this patient.   No past surgical history on file.  No current outpatient prescriptions on file.  Allergies Review of patient's allergies indicates not on file.  No family history on file.  Social History Social History  Substance Use Topics  . Smoking status: Not on file  . Smokeless tobacco: Not on file  . Alcohol Use: Not on file    Review of Systems Per the son there is no antecedent illness, cannot obtain second patient baseline mental status  ____________________________________________   PHYSICAL EXAM:  VITAL SIGNS: ED Triage Vitals  Enc Vitals Group     BP 08/16/15  1000 113/87 mmHg     Pulse Rate 08/16/15 1000 167     Resp 08/16/15 1000 24     Temp --      Temp src --      SpO2 08/16/15 1000 95 %     Weight 08/16/15 1000 132 lb 4.4 oz (60 kg)     Height --      Head Cir --      Peak Flow --      Pain Score --      Pain Loc --      Pain Edu? --      Excl. in GC? --     Constitutional: Patient is elderly, and awake in no acute distress Eyes: Conjunctivae are normal. PERRL. EOMI. Head: Atraumatic. Nose: No congestion/rhinnorhea. Mouth/Throat: Mucous membranes are dry.  Oropharynx non-erythematous. Neck: No stridor.   Nontender with no meningismus Cardiovascular: Irregularly irregular, rapid heart rate Grossly normal heart sounds.  Good peripheral circulation. Respiratory: Normal respiratory effort.  No retractions. Lungs are diminished in the bases Gastrointestinal: Soft and nontender. No distention. No guarding no rebound Back:  There is no focal tenderness or step off there is no midline tenderness there are no lesions noted. Musculoskeletal: No lower extremity tenderness. No joint effusions, no DVT signs strong distal pulses  Neurologic:  Patient does not participate in neurologic exam rendering it very difficult to obtain. She is aphasic. Skin: Reported decubitus ulcer not investigated at this time secondary to elevated heart rate  and acute patient illness   ____________________________________________   LABS (all labs ordered are listed, but only abnormal results are displayed)  Labs Reviewed  CULTURE, BLOOD (ROUTINE X 2)  CULTURE, BLOOD (ROUTINE X 2)  URINE CULTURE  DIGOXIN LEVEL  CBC WITH DIFFERENTIAL/PLATELET  TSH  COMPREHENSIVE METABOLIC PANEL  TROPONIN I  MAGNESIUM  URINALYSIS COMPLETEWITH MICROSCOPIC ([facility] ONLY)  PROTIME-INR   ____________________________________________  EKG  EKG shows a atrial fibrillation rate 170 with right bundle branch block which is consistent with prior, normal axis I personally reviewed  this ____________________________________________  RADIOLOGY  I personally reviewed radiology imaging ____________________________________________   PROCEDURES  Procedure(s) performed: None  Critical Care performed: CRITICAL CARE Performed by: Jeanmarie Plant   Total critical care time: 39 minutes  Critical care time was exclusive of separately billable procedures and treating other patients.  Critical care was necessary to treat or prevent imminent or life-threatening deterioration.  Critical care was time spent personally by me on the following activities: development of treatment plan with patient and/or surrogate as well as nursing, discussions with consultants, evaluation of patient's response to treatment, examination of patient, obtaining history from patient or surrogate, ordering and performing treatments and interventions, ordering and review of laboratory studies, ordering and review of radiographic studies, pulse oximetry and re-evaluation of patient's condition.   ____________________________________________   INITIAL IMPRESSION / ASSESSMENT AND PLAN / ED COURSE  Pertinent labs & imaging results that were available during my care of the patient were reviewed by me and considered in my medical decision making (see chart for details).  Patient presents today with a aspirational event apparently at home. However, upon arrival we do note that her heart rate is 170 and she is in A. fib with RVR. She has been in this before. Med list is not currently available however she is to be on digitalis. We will give her Cardizem as her blood pressure is in the 120s at this time and we will try to gradually bring down her heart rate. Unclear if the patient is having any symptoms with this in terms of pain or shortness of breath that she cannot report. We will also give her IV fluids as she looks dehydrated checking basic blood work. Did discuss extensively with the power of attorney,  [employer], he does not wish any heroic measures including CPR or intubation for this patient he does, however, agree with IV fluids antibiotics and other medications to control her heart rate. ____________________________________________   FINAL CLINICAL IMPRESSION(S) / ED DIAGNOSES  Final diagnoses:  Weakness     Jeanmarie Plant, MD 08/16/15 1040  Jeanmarie Plant, MD 08/16/15 1137

## 2015-08-16 NOTE — ED Notes (Signed)
Dr told about critical lactic lab value by this nurse

## 2015-08-16 NOTE — H&P (Signed)
Kristina [name] is an 81 y.o. female.   Chief Complaint: Choking HPI: Unable to obtain directly from the pt secondary to her being nonverble from CVA years ago. According to son who is POA the home nurse reported she choked then turned blue. On presentation she was in rapid a-fib with HR in 170's. Given cardizem IV then placed on drip. HR still in 120's.   No past medical history on file.  Unable to obtain, pt is nonverbal  No past surgical history on file.   Unable to obtain, pt is nonverbal  No family history on file. Unable to obtain, pt is nonverbal. Social History:  has no tobacco, alcohol, and drug history on file. Unable to obtain, pt is nonverbal.  Allergies: Not on File   (Not in a hospital admission)  Results for orders placed or performed during the hospital encounter of 08/16/15 (from the past 48 hour(s))  CBC with Differential/Platelet     Status: Abnormal   Collection Time: 08/16/15 10:21 AM  Result Value Ref Range   WBC 11.1 (H) 3.6 - 11.0 K/uL   RBC 4.59 3.80 - 5.20 MIL/uL   Hemoglobin 13.4 12.0 - 16.0 g/dL   HCT 41.1 35.0 - 47.0 %   MCV 89.6 80.0 - 100.0 fL   MCH 29.3 26.0 - 34.0 pg   MCHC 32.7 32.0 - 36.0 g/dL   RDW 15.9 (H) 11.5 - 14.5 %   Platelets 241 150 - 440 K/uL   Neutrophils Relative % 79 %   Neutro Abs 8.8 (H) 1.4 - 6.5 K/uL   Lymphocytes Relative 12 %   Lymphs Abs 1.3 1.0 - 3.6 K/uL   Monocytes Relative 8 %   Monocytes Absolute 0.9 0.2 - 0.9 K/uL   Eosinophils Relative 0 %   Eosinophils Absolute 0.0 0 - 0.7 K/uL   Basophils Relative 1 %   Basophils Absolute 0.1 0 - 0.1 K/uL  TSH     Status: None   Collection Time: 08/16/15 10:21 AM  Result Value Ref Range   TSH 2.014 0.350 - 4.500 uIU/mL  Comprehensive metabolic panel     Status: Abnormal   Collection Time: 08/16/15 10:21 AM  Result Value Ref Range   Sodium 143 135 - 145 mmol/L   Potassium 4.0 3.5 - 5.1 mmol/L   Chloride 108 101 - 111 mmol/L   CO2 27 22 - 32 mmol/L   Glucose, Bld 134 (H)  65 - 99 mg/dL   BUN 22 (H) 6 - 20 mg/dL   Creatinine, Ser 0.97 0.44 - 1.00 mg/dL   Calcium 8.8 (L) 8.9 - 10.3 mg/dL   Total Protein 7.1 6.5 - 8.1 g/dL   Albumin 3.3 (L) 3.5 - 5.0 g/dL   AST 32 15 - 41 U/L   ALT 25 14 - 54 U/L   Alkaline Phosphatase 82 38 - 126 U/L   Total Bilirubin 1.1 0.3 - 1.2 mg/dL   GFR calc non Af Amer 49 (L) >60 mL/min   GFR calc Af Amer 57 (L) >60 mL/min    Comment: (NOTE) The eGFR has been calculated using the CKD EPI equation. This calculation has not been validated in all clinical situations. eGFR's persistently <60 mL/min signify possible Chronic Kidney Disease.    Anion gap 8 5 - 15  Troponin I     Status: None   Collection Time: 08/16/15 10:21 AM  Result Value Ref Range   Troponin I <0.03 <0.031 ng/mL    Comment:  NO INDICATION OF MYOCARDIAL INJURY.   Magnesium     Status: Abnormal   Collection Time: 08/16/15 10:21 AM  Result Value Ref Range   Magnesium 2.6 (H) 1.7 - 2.4 mg/dL  Protime-INR     Status: None   Collection Time: 08/16/15 10:21 AM  Result Value Ref Range   Prothrombin Time 14.5 11.4 - 15.0 seconds   INR 1.11    Dg Chest Port 1 View  08/16/2015   CLINICAL DATA:  Unresponsive.  Recent choking episode while eating  EXAM: PORTABLE CHEST 1 VIEW  COMPARISON:  June 05, 2014  FINDINGS: There is no edema or consolidation. There is stable central peribronchial thickening. Heart is mildly enlarged with pulmonary vascularity within normal limits. There is atherosclerotic calcification in the aorta. No adenopathy. There is degenerative change in each shoulder.  IMPRESSION: Stable mild cardiac enlargement. No edema or consolidation. Evidence of a degree of chronic bronchitis, stable.   Electronically Signed   By: Lowella Grip III M.D.   On: 08/16/2015 10:42    Review of Systems  Unable to perform ROS: patient nonverbal    Blood pressure 112/77, pulse 119, resp. rate 20, weight 60 kg (132 lb 4.4 oz), SpO2 96 %. Physical Exam   Constitutional:  Mildly cachetic looking female resting comfortably.  HENT:  Head: Normocephalic and atraumatic.  Mouth/Throat: No oropharyngeal exudate.  Oral mucosa dry  Eyes: Pupils are equal, round, and reactive to light. No scleral icterus.  Neck: Neck supple. No JVD present. No tracheal deviation present. No thyromegaly present.  Cardiovascular:  Irregular heart beat. 2/6 systolic murmur  Respiratory:  Clear to ascultation No dullness to percussion No use of asseccary muscles  GI: Soft. Bowel sounds are normal. She exhibits no distension and no mass.  Musculoskeletal:  No jiont deformaties Mild edema in right upper ext.  Lymphadenopathy:    She has no cervical adenopathy.  Neurological:  Non verbal. Does not respond to commands. Moves all extremities.  Skin: Skin is warm and dry. No rash noted.     Assessment/Plan 1. Atrial Fib with RVR: Has been refractory so far to treatment. Now on cardizem drip. Cannot take oral meds at this point until swallowing can be further evaluated. Will admit to stepdown. Try to wean off drip. Electrolytes normal.  2. Dysphagia: Pt failed bedside swallowing at bedside in ED. Will keep her NPO and get more extensive swallowing study. If passes then can restart oral meds for rate control. If fails then consider hospice.  3. HTN: Monitor or now. Cardizem drip should control until can restart oral meds.  4. Code Status: Pt is DNR. Confirmed by her son who has POA.  Case discussed with Dr Audelia Acton. Reviewed past medical records.  Time spent= 43mn  JBaxter Hire10/11/2014, 12:45 PM

## 2015-08-17 ENCOUNTER — Encounter: Payer: Self-pay | Admitting: *Deleted

## 2015-08-17 DIAGNOSIS — L8990 Pressure ulcer of unspecified site, unspecified stage: Secondary | ICD-10-CM

## 2015-08-17 LAB — GLUCOSE, CAPILLARY: GLUCOSE-CAPILLARY: 82 mg/dL (ref 65–99)

## 2015-08-17 MED ORDER — DEXTROSE 5 % IV SOLN
1.0000 g | INTRAVENOUS | Status: DC
  Administered 2015-08-17 – 2015-08-19 (×3): 1 g via INTRAVENOUS
  Filled 2015-08-17 (×3): qty 10

## 2015-08-17 MED ORDER — FOSFOMYCIN TROMETHAMINE 3 G PO PACK
3.0000 g | PACK | Freq: Once | ORAL | Status: DC
  Filled 2015-08-17: qty 3

## 2015-08-17 MED ORDER — ASPIRIN 300 MG RE SUPP
300.0000 mg | Freq: Every day | RECTAL | Status: DC
  Administered 2015-08-17 – 2015-08-18 (×2): 300 mg via RECTAL
  Filled 2015-08-17 (×2): qty 1

## 2015-08-17 NOTE — Progress Notes (Signed)
[facility] - [place] at [facility]   PATIENT NAME: Kristina [name]    MR#:  [id number]  DATE OF BIRTH:  08/28/1920  SUBJECTIVE:  CHIEF COMPLAINT:   Chief Complaint  Patient presents with  . Choking   patient is 81 year old Caucasian female with past medical history of stroke and aphasia who presents to the hospital was choking. In emergency room, she was noted to be in A. fib, RVR with rates of 170. Patient was given Cardizem IV bolus and then placed on IV drip, remains on IV drip. Not able to get review of systems. Vital signs are otherwise normal. Chest x-ray done in the emergency room revealed possible bronchitis, which is chronic.   Review of Systems  Unable to perform ROS: patient nonverbal    VITAL SIGNS: Blood pressure 120/70, pulse 87, temperature 97.8 F (36.6 C), temperature source Oral, resp. rate 15, height  (1.727 m), weight 64 kg (141 lb 1.5 oz), SpO2 96 %.  PHYSICAL EXAMINATION:   GENERAL:  81 y.o.-year-old patient lying in the bed with no acute distress. Nonverbal EYES: Pupils equal, round, reactive to light and accommodation. No scleral icterus. Extraocular muscles intact.  HEENT: Head atraumatic, normocephalic. Oropharynx and nasopharynx clear.  NECK:  Supple, no jugular venous distention. No thyroid enlargement, no tenderness.  LUNGS: Send breath sounds bilaterally, no wheezing, bilateral rales, rhonchi and crepitations were heard bilaterally, inspiratory and expiratory. No use of accessory muscles of respiration. Intermittent dry cough.  CARDIOVASCULAR: S1, S2 , tachycardic, irregularly irregular. No murmurs, rubs, or gallops.  ABDOMEN: Soft, nontender, nondistended. Bowel sounds present. No organomegaly or mass.  EXTREMITIES: No pedal edema, cyanosis, or clubbing.  NEUROLOGIC: Cranial nerves II through XII are intact. Muscle strength 5/5 in all extremities. Sensation intact. Gait not checked.  PSYCHIATRIC: The patient is alert , not  able to assess her orientation as patient is nonverbal  SKIN: No obvious rash, lesion, or ulcer.   ORDERS/RESULTS REVIEWED:   CBC  Recent Labs Lab 08/16/15 1021  WBC 11.1*  HGB 13.4  HCT 41.1  PLT 241  MCV 89.6  MCH 29.3  MCHC 32.7  RDW 15.9*  LYMPHSABS 1.3  MONOABS 0.9  EOSABS 0.0  BASOSABS 0.1   ------------------------------------------------------------------------------------------------------------------  Chemistries   Recent Labs Lab 08/16/15 1021  NA 143  K 4.0  CL 108  CO2 27  GLUCOSE 134*  BUN 22*  CREATININE 0.97  CALCIUM 8.8*  MG 2.6*  AST 32  ALT 25  ALKPHOS 82  BILITOT 1.1   ------------------------------------------------------------------------------------------------------------------ estimated creatinine clearance is 36.6 mL/min (by C-G formula based on Cr of 0.97). ------------------------------------------------------------------------------------------------------------------  Recent Labs  08/16/15 1021  TSH 2.014    Cardiac Enzymes  Recent Labs Lab 08/16/15 1021  TROPONINI <0.03   ------------------------------------------------------------------------------------------------------------------ Invalid input(s): POCBNP ---------------------------------------------------------------------------------------------------------------  RADIOLOGY: Dg Chest Port 1 View  08/16/2015   CLINICAL DATA:  Unresponsive.  Recent choking episode while eating  EXAM: PORTABLE CHEST 1 VIEW  COMPARISON:  June 05, 2014  FINDINGS: There is no edema or consolidation. There is stable central peribronchial thickening. Heart is mildly enlarged with pulmonary vascularity within normal limits. There is atherosclerotic calcification in the aorta. No adenopathy. There is degenerative change in each shoulder.  IMPRESSION: Stable mild cardiac enlargement. No edema or consolidation. Evidence of a degree of chronic bronchitis, stable.   Electronically Signed   By:  Bretta Bang III M.D.   On: 08/16/2015 10:42    EKG:  Orders placed or performed  during the hospital encounter of 08/16/15  . ED EKG  . ED EKG    ASSESSMENT AND PLAN:  Active Problems:   Atrial fibrillation with RVR (HCC)   Pressure ulcer 1. Atrial fibrillation with rapid ventricle response, continue IV Cardizem for now until patient is evaluated by speech therapist and allowed to take by mouth medications, patient's heart rate is well controlled at present 2. Aspiration bronchitis, patient was initiated on the Rocephin, follow clinically changed to Zosyn if needed 3. Dysphagia. Speech therapist evaluated patient and make decisions about taking food or medications orally. We may need to discuss this patient's family about PEG tube placement if not safe to take orally. Involve palliative care 4. Urinary tract infection. Initiate patient on Rocephin. Get urine culture, adjust antibiotics depending on culture results 5. Leukocytosis. Follow with therapy 6. Metabolic acidosis likely due to poor perfusion with A. fib, RVR, also infection, improved with therapy 7. History of stroke. Initiate patient on rectal aspirin   Management plans discussed with the patient, family and they are in agreement.   DRUG ALLERGIES: Not on File  CODE STATUS:     Code Status Orders        Start     Ordered   08/16/15 1425  Do not attempt resuscitation (DNR)   Continuous    Question Answer Comment  In the event of cardiac or respiratory ARREST Do not call a "code blue"   In the event of cardiac or respiratory ARREST Do not perform Intubation, CPR, defibrillation or ACLS   In the event of cardiac or respiratory ARREST Use medication by any route, position, wound care, and other measures to relive pain and suffering. May use oxygen, suction and manual treatment of airway obstruction as needed for comfort.      08/16/15 1424      TOTAL TIME TAKING CARE OF THIS PATIENT: 45 minutes.     Katharina Caper M.D on 08/17/2015 at 11:06 AM  Between 7am to 6pm - Pager - [phone]  After 6pm go to [email or web address] - password [email or web address] [facility]  [place] [facility]  Office  [phone]  CC: Primary care physician; Sula Rumple, MD

## 2015-08-17 NOTE — Consult Note (Signed)
WOC wound consult note completed via [employer] Reason for Consult: pressure injury Wound type: Stage 1 pressure injury with central deep tissue injury left trochanter Stage I pressure injury right trochanter Sacrum/gluteal cleft intact, healed pressure ulcer, without history will not know staging Pt from home, she is cared for by [facility]. She has air mattress with hospital bed at home.  Heels are intact per bedside nurse, silicone foam on heels to protect.   Pressure Ulcer POA: Yes x 2 Measurement: Left hip: 7cm x 6cm x 0 with central deep tissue injury 0.5cm x 0.5cm  Right hip: 6.5cm x 6.0cm x 0 Sacrum healed, pink reepithelialized  tissue Wound bed: trochanter wounds are intact, not blanchable. Drainage (amount, consistency, odor) none Periwound:intact  Dressing procedure/placement/frequency: Soft silicone foam to protect all areas, change every 3 days and PRN soilage.  Sport mattress while in ICU will need low air loss mattress if she transfers from the ICU, she has PI on 2 of her 4 turning surfaces. Add Prevalon boots to protect the heels.  Discussed POC with patient and bedside nurse.  Re consult if needed, will not follow at this time. Thanks  Dyron Kawano [employer], CWOCN [phone])

## 2015-08-18 ENCOUNTER — Encounter: Payer: Self-pay | Admitting: *Deleted

## 2015-08-18 ENCOUNTER — Inpatient Hospital Stay: Payer: Medicare Other

## 2015-08-18 MED ORDER — ENOXAPARIN SODIUM 40 MG/0.4ML ~~LOC~~ SOLN
40.0000 mg | SUBCUTANEOUS | Status: DC
  Administered 2015-08-18 – 2015-08-20 (×3): 40 mg via SUBCUTANEOUS
  Filled 2015-08-18 (×4): qty 0.4

## 2015-08-18 NOTE — Care Management Important Message (Signed)
Important Message  Patient Details  Name: Kristina [name] MRN: [id number] Date of Birth: 1920-04-29   Medicare Important Message Given:  Yes-second notification given    Marily Memos, RN 08/18/2015, 1:09 PM

## 2015-08-18 NOTE — Progress Notes (Signed)
Patient heart rate increasing afib 120-130's therefore diltiazem drip titrated to /H. Will continue to monitor.

## 2015-08-18 NOTE — Consult Note (Signed)
[facility] Cardiology Consultation Note  Patient ID: Kristina [name], MRN: [id number], DOB/AGE: 1920/05/24 81 y.o. Admit date: 08/16/2015   Date of Consult: 08/18/2015 Primary Physician: Sula Rumple, MD Primary Cardiologist: None  Chief Complaint:  Chief Complaint  Patient presents with  . Choking   Reason for Consult: atrial fibrillation with rapid ventricular rate  HPI: 81 y.o. female with the failure to thrive and significant shortness of breath for which the patient apparently had acute onset of atrial fibrillation with rapid ventricular rate. The patient has been treated with diltiazem drip and also has historically had diltiazem medication management for this problem which suggests she has had atrial fibrillation in the past. She is not on anticoagulation due to fall risk and other concerns and is demented and therefore cannot assess. Currently her EKG shows atrial fibrillation with variable rate on diltiazem drip. Is no current evidence of myocardial infarction with normal troponin  No past medical history on file.    Surgical History: No past surgical history on file.   Home Meds: Prior to Admission medications   Medication Sig Start Date End Date Taking? Authorizing Provider  diltiazem (DILACOR XR) 180 MG 24 hr capsule Take 180 mg by mouth daily. 07/17/15  Yes Historical Provider, MD  furosemide (LASIX) 80 MG tablet Take 80 mg by mouth daily. 07/17/15  Yes Historical Provider, MD  potassium chloride SA (K-DUR,KLOR-CON) 20 MEQ tablet Take 20 mEq by mouth 2 (two) times daily. 06/16/15  Yes Historical Provider, MD    Inpatient Medications:  . antiseptic oral rinse  7 mL Mouth Rinse q12n4p  . aspirin  300 mg Rectal Daily  . cefTRIAXone (ROCEPHIN)  IV  1 g Intravenous Q24H  . chlorhexidine  15 mL Mouth Rinse BID  . enoxaparin (LOVENOX) injection  40 mg Subcutaneous Q24H   . sodium chloride 120 mL/hr at 08/18/15 1323  . diltiazem (CARDIZEM) infusion 10 mg/hr (08/18/15 0912)     Allergies: Not on File  Social History   Social History  . Marital Status: Married    Spouse Name: N/A  . Number of Children: N/A  . Years of Education: N/A   Occupational History  . Not on file.   Social History Main Topics  . Smoking status: Unknown If Ever Smoked  . Smokeless tobacco: Not on file  . Alcohol Use: Not on file  . Drug Use: Not on file  . Sexual Activity: Not on file   Other Topics Concern  . Not on file   Social History Narrative  . No narrative on file     No family history on file.   Review of Systems  Unable to assess due to dementia  Labs:  Recent Labs  08/16/15 1021  TROPONINI <0.03   Lab Results  Component Value Date   WBC 11.1* 08/16/2015   HGB 13.4 08/16/2015   HCT 41.1 08/16/2015   MCV 89.6 08/16/2015   PLT 241 08/16/2015    Recent Labs Lab 08/16/15 1021  NA 143  K 4.0  CL 108  CO2 27  BUN 22*  CREATININE 0.97  CALCIUM 8.8*  PROT 7.1  BILITOT 1.1  ALKPHOS 82  ALT 25  AST 32  GLUCOSE 134*   No results found for: CHOL, HDL, LDLCALC, TRIG No results found for: DDIMER  Radiology/Studies:  Dg Chest Port 1 View  08/16/2015   CLINICAL DATA:  Unresponsive.  Recent choking episode while eating  EXAM: PORTABLE CHEST 1 VIEW  COMPARISON:  June 05, 2014  FINDINGS: There is no edema or consolidation. There is stable central peribronchial thickening. Heart is mildly enlarged with pulmonary vascularity within normal limits. There is atherosclerotic calcification in the aorta. No adenopathy. There is degenerative change in each shoulder.  IMPRESSION: Stable mild cardiac enlargement. No edema or consolidation. Evidence of a degree of chronic bronchitis, stable.   Electronically Signed   By: Bretta Bang III M.D.   On: 08/16/2015 10:42    EKG: Atrial fibrillation with controlled ventricular rate  Weights: Filed Weights   08/16/15 1000 08/16/15 1600 08/17/15 0903  Weight: 132 lb 4.4 oz (60 kg) 135 lb 5.8 oz (61.4 kg) 141 lb  1.5 oz (64 kg)     Physical Exam: Blood pressure 134/63, pulse 94, temperature 98.3 F (36.8 C), temperature source Axillary, resp. rate 20, height  (1.727 m), weight 141 lb 1.5 oz (64 kg), SpO2 98 %. Body mass index is 21.46 kg/(m^2). General: Well developed, well nourished, in no acute distress. Head eyes ears nose throat: Normocephalic, atraumatic, sclera non-icteric, no xanthomas, nares are without discharge. No apparent thyromegaly and/or mass  Lungs: Normal respiratory effort.  Few wheezes, basilar rales, no rhonchi.  Heart: Irregular with normal S1 S2. no murmur gallop, no rub, PMI is normal size and placement, carotid upstroke normal without bruit, jugular venous pressure is normal Abdomen: Soft, non-tender, non-distended with normoactive bowel sounds. No hepatomegaly. No rebound/guarding. No obvious abdominal masses. Abdominal aorta is normal size without bruit Extremities: Trace edema. no cyanosis, no clubbing, no ulcers  Peripheral : 2+ bilateral upper extremity pulses, 2+ bilateral femoral pulses, 2+ bilateral dorsal pedal pulse Neuro: Not  oriented. No facial asymmetry. No focal deficit. Moves all extremities spontaneously. Musculoskeletal: Normal muscle tone without kyphosis      Assessment: 81 year old female with a history of atrial fibrillation have an episode of atrial fibrillation with rapid ventricular rate and no current evidence of congestive heart failure or myocardial infarction with normal troponin  Plan: 1. Continue diltiazem drip for heart rate control of 110 bpm or better 2. No anticoagulation due to concerns of fall risk and bleeding risk 3. Reinstatement oral diltiazem 180 mg each day for better heart rate control of atrial fibrillation 5. Echocardiogram for further evaluation and medication management changes  Signed, Lamar Blinks M.D. [facility] Cardiology 08/18/2015, 1:34 PM

## 2015-08-18 NOTE — Evaluation (Signed)
Clinical/Bedside Swallow Evaluation Patient Details  Name: Kristina [name] MRN: [id number] Date of Birth: [birth year]  Today's Date: 08/18/2015 Time: SLP Start Time (ACUTE ONLY): 1330 SLP Stop Time (ACUTE ONLY): 1400 SLP Time Calculation (min) (ACUTE ONLY): 30 min  Past Medical History: No past medical history on file. Past Surgical History: No past surgical history on file. HPI:  Kristina [name] is a 81 y.o. female who is aphasic at baselinepresents today from EMS. She comes her home where she has to wait for nursing. She presents with her son who brings power of attorney paperwork. He is the power of attorney. He states the patient is essentially DO NOT RESUSCITATE. He does not wish intubation he does not wish heroic measures [name] as CPR. He is okay however if we give medications to try to improve her condition. According to him she has not had much to eat or drink the last few days. He has not been with her the entire time and has been home health. According to EMS, home health and the patient's POA reports that this morning she choked on her Jell-O. This has happened before. He is unsure of exactly what medications she is on however is indicated that she did not take any this morning. There is been no significant change in mental status prior to this. The patient herself cannot give a history.   Assessment / Plan / Recommendation Clinical Impression  Pt presents with pharygneal phase dysphagia c/b immediate coughing after honey thick liquid trials. Pt also has oral phase dysphagia c/b prolonged oral transit, unwillingess to open mouth for ice chips, and oral holding. Pt showed immediate s/s of aspiration when drinking honey thick by cup; recommend thickened liquids by tsp only with strict aspiration precautions. Pt's moderate-severe degree of oralpharyngeal phase dysphagia greatly increases her risk for aspiration. MD consulted and agree with a trial of puree diet with pudding thick liquids (by tsp).  MD and palliative care MD will consult son (POA) regarded pleasure POs/PO diet. Aspiration precautions reviewed with family/caregiver and posted in room. NSG updated.       Aspiration Risk  Moderate (-severe)    Diet Recommendation Dysphagia 1 (Puree); Pudding-Thick liquids -- STOP POs if increased coughing noted.  Medication Administration: Crushed with puree (as able) Compensations: Small sips/bites;Check for pocketing;Slow rate;Minimize environmental distractions    Other  Recommendations Oral Care Recommendations: Oral care BID;Staff/trained caregiver to provide oral care   Follow Up Recommendations       Frequency and Duration min 3x/week  1 week   Pertinent Vitals/Pain None reported    SLP Swallow Goals  See plan of care   Swallow Study Prior Functional Status   Pt on dysphagia 3 diet with thin liquids at home; lives in home with [facility].    General Date of Onset: 08/16/15 Other Pertinent Information: Kristina [name] is a 81 y.o. female who is aphasic at baselinepresents today from EMS. She comes her home where she has to wait for nursing. She presents with her son who brings power of attorney paperwork. He is the power of attorney. He states the patient is essentially DO NOT RESUSCITATE. He does not wish intubation he does not wish heroic measures [name] as CPR. He is okay however if we give medications to try to improve her condition. According to him she has not had much to eat or drink the last few days. He has not been with her the entire time and has been home health. According to  EMS, home health and the patient's POA reports that this morning she choked on her Jell-O. This has happened before. He is unsure of exactly what medications she is on however is indicated that she did not take any this morning. There is been no significant change in mental status prior to this. The patient herself cannot give a history. Type of Study: Bedside swallow evaluation Diet Prior to this  Study: Dysphagia 3 (soft);Thin liquids Temperature Spikes Noted: No Respiratory Status: Room air History of Recent Intubation: No Behavior/Cognition: Alert;Confused;Requires cueing;Distractible;Uncooperative Oral Cavity - Dentition: Missing dentition Self-Feeding Abilities: Total assist Patient Positioning: Upright in bed Baseline Vocal Quality: Not observed (nonverbal at baseline)    Oral/Motor/Sensory Function Overall Oral Motor/Sensory Function: Impaired at baseline (Old CVA)   Ice Chips Ice chips:  (ST unable to deliver)   Thin Liquid Thin Liquid: Not tested    Nectar Thick Nectar Thick Liquid: Not tested   Honey Thick Honey Thick Liquid: Impaired Presentation: Cup Oral Phase Impairments: Poor awareness of bolus Oral Phase Functional Implications: Oral holding Pharyngeal Phase Impairments: Cough - Immediate Other Comments: Pt held liquid in mouth for a significant amount of time and when she did finally swallow, she immediately coughed several times   Puree Puree: Within functional limits Presentation: Spoon Other Comments: Family and caregiver fed applesauce by tsp; no immediate overt s/s of aspiration noted on puree trials   Solid   GO    Solid: Not tested       Kristina [name] 08/18/2015,2:03 PM

## 2015-08-18 NOTE — Progress Notes (Signed)
Notified Dr. Renae Gloss that patient has only had 200cc of urine output and getting 120cc/H of IVF.  Dr. Renae Gloss acknowledged and stated "I dont want to give anymore fluids with her age." no new orders.

## 2015-08-18 NOTE — Progress Notes (Addendum)
[facility] -  at [facility]   PATIENT NAME: Kristina [name]    MR#:  [id number]  DATE OF BIRTH:  1920/05/16  SUBJECTIVE:  CHIEF COMPLAINT:   Chief Complaint  Patient presents with  . Choking   patient is 81 year old Caucasian female with past medical history of stroke and aphasia who presents to the hospital was choking. In emergency room, she was noted to be in A. fib, RVR with rates of 170. Patient was given Cardizem IV bolus and then placed on IV drip, remains on IV drip. Not able to get review of systems. Vital signs are otherwise normal. Chest x-ray done in the emergency room revealed possible bronchitis, which is chronic.  Patient's heart rate is increasing to 120 and the diltiazem was uptitrated to 10 mg an hour. Urine cultures are pending. Blood cultures sent negative so far Review of Systems  Unable to perform ROS: patient nonverbal    VITAL SIGNS: Blood pressure 119/66, pulse 73, temperature 98.3 F (36.8 C), temperature source Axillary, resp. rate 19, height  (1.727 m), weight 64 kg (141 lb 1.5 oz), SpO2 96 %.  PHYSICAL EXAMINATION:   GENERAL:  81 y.o.-year-old patient lying in the bed with no acute distress. Nonverbal EYES: Pupils equal, round, reactive to light and accommodation. No scleral icterus. Extraocular muscles intact.  HEENT: Head atraumatic, normocephalic. Oropharynx and nasopharynx clear.  NECK:  Supple, no jugular venous distention. No thyroid enlargement, no tenderness.  LUNGS: Normal breath sounds bilaterally, no wheezing, few bilateral rhonchi , but no crepitations were heard today.  No use of accessory muscles of respiration. Intermittent dry cough.  CARDIOVASCULAR: S1, S2 , tachycardic, irregularly irregular. No murmurs, rubs, or gallops.  ABDOMEN: Soft, nontender, nondistended. Bowel sounds present. No organomegaly or mass.  EXTREMITIES: No pedal edema, cyanosis, or clubbing.  NEUROLOGIC: Cranial nerves II through XII  are intact. Muscle strength 5/5 in all extremities. Sensation intact. Gait not checked.  PSYCHIATRIC: The patient is alert , not able to assess her orientation as patient is nonverbal  SKIN: No obvious rash, lesion, or ulcer.   ORDERS/RESULTS REVIEWED:   CBC  Recent Labs Lab 08/16/15 1021  WBC 11.1*  HGB 13.4  HCT 41.1  PLT 241  MCV 89.6  MCH 29.3  MCHC 32.7  RDW 15.9*  LYMPHSABS 1.3  MONOABS 0.9  EOSABS 0.0  BASOSABS 0.1   ------------------------------------------------------------------------------------------------------------------  Chemistries   Recent Labs Lab 08/16/15 1021  NA 143  K 4.0  CL 108  CO2 27  GLUCOSE 134*  BUN 22*  CREATININE 0.97  CALCIUM 8.8*  MG 2.6*  AST 32  ALT 25  ALKPHOS 82  BILITOT 1.1   ------------------------------------------------------------------------------------------------------------------ estimated creatinine clearance is 36.6 mL/min (by C-G formula based on Cr of 0.97). ------------------------------------------------------------------------------------------------------------------  Recent Labs  08/16/15 1021  TSH 2.014    Cardiac Enzymes  Recent Labs Lab 08/16/15 1021  TROPONINI <0.03   ------------------------------------------------------------------------------------------------------------------ Invalid input(s): POCBNP ---------------------------------------------------------------------------------------------------------------  RADIOLOGY: No results found.  EKG:  Orders placed or performed during the hospital encounter of 08/16/15  . ED EKG  . ED EKG    ASSESSMENT AND PLAN:  Active Problems:   Atrial fibrillation with RVR (HCC)   Pressure ulcer 1. Atrial fibrillation with rapid ventricle response, continue IV Cardizem for now until patient is evaluated by speech therapist and allowed to take by mouth medications, patient's heart rate is relatively well controlled at present. We will be  getting palliative care involved for further discussions  with family in regards to artificial nutrition, including PEG tube placement if needed. Prolonged discussion this patient's in today for proximal to 15 minutes about the same.  2. Aspiration bronchitis, continue Rocephin, stable clinically 3. Dysphagia. Speech therapist evaluated patient and make decisions about taking food or medications orally. We may need to discuss this patient's family about PEG tube placement if not safe to take orally. Involving palliative care. Get CT scan of the head to rule out stroke 4. Urinary tract infection. Continue patient on Rocephin. urine culture results are pending, adjust antibiotics depending on culture results 5. Leukocytosis. Follow with therapy 6. Metabolic acidosis likely due to poor perfusion with A. fib, RVR, also infection, improved with therapy 7. History of stroke. Continue patient on rectal aspirin, get CT scan of head without contrast or MRI of brain. If no improvement with conservative therapy 8. Encephalopathy, suspected due to urinary tract infection. However, cannot rule out recurrent stroke, although patient is on aspirin therapy rectally. Get CT scan of the head without contrast. Follow clinically Management plans discussed with the patient, family and they are in agreement.   DRUG ALLERGIES: Not on File  CODE STATUS:     Code Status Orders        Start     Ordered   08/16/15 1425  Do not attempt resuscitation (DNR)   Continuous    Question Answer Comment  In the event of cardiac or respiratory ARREST Do not call a "code blue"   In the event of cardiac or respiratory ARREST Do not perform Intubation, CPR, defibrillation or ACLS   In the event of cardiac or respiratory ARREST Use medication by any route, position, wound care, and other measures to relive pain and suffering. May use oxygen, suction and manual treatment of airway obstruction as needed for comfort.      08/16/15  1424      TOTAL TIME TAKING CARE OF THIS PATIENT: 60 minutes.   Approximately 15-20 minutes spent on discussions with patient's son, Mr. Dontasia Miranda, medical issues were discussed including artificial nutrition, agree to involve palliative care. All questions were onset patient's son voiced understanding. Emotional support was provided [facility] M.D on 08/18/2015 at 12:55 PM  Between 7am to 6pm - Pager - [phone]  After 6pm go to [email or web address] - password [email or web address] [facility]  [place] [facility]  Office  [phone]  CC: Primary care physician; Sula Rumple, MD

## 2015-08-19 DIAGNOSIS — Z79899 Other long term (current) drug therapy: Secondary | ICD-10-CM

## 2015-08-19 DIAGNOSIS — D649 Anemia, unspecified: Secondary | ICD-10-CM

## 2015-08-19 DIAGNOSIS — E876 Hypokalemia: Secondary | ICD-10-CM

## 2015-08-19 DIAGNOSIS — I4891 Unspecified atrial fibrillation: Principal | ICD-10-CM

## 2015-08-19 DIAGNOSIS — R1310 Dysphagia, unspecified: Secondary | ICD-10-CM

## 2015-08-19 DIAGNOSIS — N390 Urinary tract infection, site not specified: Secondary | ICD-10-CM

## 2015-08-19 DIAGNOSIS — Z515 Encounter for palliative care: Secondary | ICD-10-CM

## 2015-08-19 DIAGNOSIS — L89221 Pressure ulcer of left hip, stage 1: Secondary | ICD-10-CM

## 2015-08-19 DIAGNOSIS — Z8673 Personal history of transient ischemic attack (TIA), and cerebral infarction without residual deficits: Secondary | ICD-10-CM

## 2015-08-19 DIAGNOSIS — D72829 Elevated white blood cell count, unspecified: Secondary | ICD-10-CM

## 2015-08-19 DIAGNOSIS — J189 Pneumonia, unspecified organism: Secondary | ICD-10-CM

## 2015-08-19 DIAGNOSIS — L89211 Pressure ulcer of right hip, stage 1: Secondary | ICD-10-CM

## 2015-08-19 DIAGNOSIS — E872 Acidosis: Secondary | ICD-10-CM

## 2015-08-19 DIAGNOSIS — Z7982 Long term (current) use of aspirin: Secondary | ICD-10-CM

## 2015-08-19 DIAGNOSIS — E870 Hyperosmolality and hypernatremia: Secondary | ICD-10-CM

## 2015-08-19 DIAGNOSIS — Z66 Do not resuscitate: Secondary | ICD-10-CM

## 2015-08-19 LAB — CBC
HCT: 35.1 % (ref 35.0–47.0)
HEMOGLOBIN: 11.8 g/dL — AB (ref 12.0–16.0)
MCH: 30.2 pg (ref 26.0–34.0)
MCHC: 33.5 g/dL (ref 32.0–36.0)
MCV: 90.2 fL (ref 80.0–100.0)
Platelets: 208 10*3/uL (ref 150–440)
RBC: 3.89 MIL/uL (ref 3.80–5.20)
RDW: 16.1 % — ABNORMAL HIGH (ref 11.5–14.5)
WBC: 8 10*3/uL (ref 3.6–11.0)

## 2015-08-19 LAB — BASIC METABOLIC PANEL
Anion gap: 6 (ref 5–15)
BUN: 13 mg/dL (ref 6–20)
CHLORIDE: 120 mmol/L — AB (ref 101–111)
CO2: 22 mmol/L (ref 22–32)
CREATININE: 0.72 mg/dL (ref 0.44–1.00)
Calcium: 8 mg/dL — ABNORMAL LOW (ref 8.9–10.3)
GFR calc Af Amer: >60 mL/min (ref >60)
GFR calc non Af Amer: >60 mL/min (ref >60)
Glucose, Bld: 111 mg/dL — ABNORMAL HIGH (ref 65–99)
Potassium: 3 mmol/L — ABNORMAL LOW (ref 3.5–5.1)
SODIUM: 148 mmol/L — AB (ref 135–145)

## 2015-08-19 LAB — MAGNESIUM: Magnesium: 2.1 mg/dL (ref 1.7–2.4)

## 2015-08-19 MED ORDER — POTASSIUM CHLORIDE 10 MEQ/100ML IV SOLN
10.0000 meq | INTRAVENOUS | Status: AC
  Administered 2015-08-19 (×4): 10 meq via INTRAVENOUS
  Filled 2015-08-19 (×4): qty 100

## 2015-08-19 MED ORDER — DILTIAZEM HCL 60 MG PO TABS
60.0000 mg | ORAL_TABLET | Freq: Three times a day (TID) | ORAL | Status: DC
  Administered 2015-08-19: 60 mg via ORAL
  Filled 2015-08-19: qty 1

## 2015-08-19 MED ORDER — DILTIAZEM HCL 60 MG PO TABS
60.0000 mg | ORAL_TABLET | Freq: Two times a day (BID) | ORAL | Status: DC
  Administered 2015-08-19: 60 mg via ORAL
  Filled 2015-08-19 (×2): qty 1

## 2015-08-19 MED ORDER — ASPIRIN 81 MG PO CHEW
324.0000 mg | CHEWABLE_TABLET | Freq: Every day | ORAL | Status: DC
  Administered 2015-08-20 – 2015-08-21 (×2): 324 mg via ORAL
  Filled 2015-08-19 (×3): qty 4

## 2015-08-19 NOTE — Consult Note (Signed)
WOC wound consult note Reason for Consult: Bilateral trochanter Stage I pressure injury.  Left hip has deep tissue injury in center.  Both are still intact at this time.  Wound type:Pressure injury Pressure Ulcer POA: Yes Measurement: Left hip: 7cm x 6cm x 0 with central deep tissue injury 0.5cm x 0.5cm  Right hip: 6.5cm x 6.0cm x 0 Sacrum healed, pink reepithelialized tissue Wound bed: All are intact at this time Drainage (amount, consistency, odor) none Periwound:intact Dressing procedure/placement/frequency: Continue with pink silicone foam.  Change every 3 days and PRN soilage.  MAy transfer to unit today, will put a pending order in for mattress replacement with low air loss feature.  Prevalon boots are in place today.  Poor PO intake noted today.  Will not follow at this time.  Please re-consult if needed.  Maple Hudson RN BSN CWON Pager [phone]

## 2015-08-19 NOTE — Progress Notes (Signed)
Patient moved to room 251 by bed with Kathlene November, orderly and Carol(home aide) at side.  2A tele monitor on patient and verified with Evon, tele monitor prior to patient leaving ICU.

## 2015-08-19 NOTE — Progress Notes (Signed)
RN notified Dr. Gwen Pounds that patient's heart rate is controlled in 80-90's but has had some pauses in rythm at times.  RN asked MD if patient needed to stay as a stepdown patient or moved to 2A.  Dr. Gwen Pounds stated "okay, change cardiazem to BID  PO and from my standpoint she can be moved to 2A."

## 2015-08-19 NOTE — Progress Notes (Signed)
RN spoke with Dr. Winona Legato and made her aware of urine output of 225cc so far this shift and that patient's heart rate is controlled 80-90's on PO cardizem and that patient had some rhythm pauses and that Dr. Gwen Pounds is aware and decreased PO cardiazem frequency to BID. Dr. Winona Legato stated "you can transfer her to 2A and discontinue her foley."

## 2015-08-19 NOTE — Progress Notes (Signed)
[facility] - [place] at [facility]   PATIENT NAME: Kristina [name]    MR#:  [id number]  DATE OF BIRTH:  01/12/1920  SUBJECTIVE:  CHIEF COMPLAINT:   Chief Complaint  Patient presents with  . Choking   patient is 81 year old Caucasian female with past medical history of stroke and aphasia who presents to the hospital was choking. In emergency room, she was noted to be in A. fib, RVR with rates of 170. Patient was given Cardizem IV bolus and then placed on IV drip. Patient was evaluated by speech therapist and recommended dysphagia diet. She was initiated on Cardizem orally. Heart rate is well controlled on oral medications. Urine cultures apparently were never sent, repeated urine cultures going to be performed today. This is third day of Rocephin injections for suspected urinary tract infection.     Review of Systems  Unable to perform ROS: patient nonverbal    VITAL SIGNS: Blood pressure 121/70, pulse 94, temperature 98.1 F (36.7 C), temperature source Oral, resp. rate 19, height  (1.727 m), weight 68.8 kg (151 lb 10.8 oz), SpO2 98 %.  PHYSICAL EXAMINATION:   GENERAL:  81 y.o.-year-old patient lying in the bed with no acute distress. Nonverbal EYES: Pupils equal, round, reactive to light and accommodation. No scleral icterus. Extraocular muscles intact.  HEENT: Head atraumatic, normocephalic. Oropharynx and nasopharynx clear.  NECK:  Supple, no jugular venous distention. No thyroid enlargement, no tenderness.  LUNGS: Normal breath sounds bilaterally, no wheezing, few bilateral rhonchi , but no crepitations were heard today.  No use of accessory muscles of respiration. Intermittent dry cough.  CARDIOVASCULAR: S1, S2 , tachycardic, irregularly irregular. No murmurs, rubs, or gallops.  ABDOMEN: Soft, nontender, nondistended. Bowel sounds present. No organomegaly or mass.  EXTREMITIES: No pedal edema, cyanosis, or clubbing.  NEUROLOGIC: Cranial nerves II  through XII are intact. Muscle strength 5/5 in all extremities. Sensation intact. Gait not checked.  PSYCHIATRIC: The patient is alert , not able to assess her orientation as patient is nonverbal  SKIN: No obvious rash, lesion, or ulcer.   ORDERS/RESULTS REVIEWED:   CBC  Recent Labs Lab 08/16/15 1021 08/19/15 0754  WBC 11.1* 8.0  HGB 13.4 11.8*  HCT 41.1 35.1  PLT 241 208  MCV 89.6 90.2  MCH 29.3 30.2  MCHC 32.7 33.5  RDW 15.9* 16.1*  LYMPHSABS 1.3  --   MONOABS 0.9  --   EOSABS 0.0  --   BASOSABS 0.1  --    ------------------------------------------------------------------------------------------------------------------  Chemistries   Recent Labs Lab 08/16/15 1021 08/19/15 0754  NA 143 148*  K 4.0 3.0*  CL 108 120*  CO2 27 22  GLUCOSE 134* 111*  BUN 22* 13  CREATININE 0.97 0.72  CALCIUM 8.8* 8.0*  MG 2.6* 2.1  AST 32  --   ALT 25  --   ALKPHOS 82  --   BILITOT 1.1  --    ------------------------------------------------------------------------------------------------------------------ estimated creatinine clearance is 44.3 mL/min (by C-G formula based on Cr of 0.72). ------------------------------------------------------------------------------------------------------------------ No results for input(s): TSH, T4TOTAL, T3FREE, THYROIDAB in the last 72 hours.  Invalid input(s): FREET3  Cardiac Enzymes  Recent Labs Lab 08/16/15 1021  TROPONINI <0.03   ------------------------------------------------------------------------------------------------------------------ Invalid input(s): POCBNP ---------------------------------------------------------------------------------------------------------------  RADIOLOGY: Ct Head Wo Contrast  08/18/2015   CLINICAL DATA:  81 old female with past medical history of stroke and aphasia presenting today with choking.  EXAM: CT HEAD WITHOUT CONTRAST  TECHNIQUE: Contiguous axial images were obtained from the base  of the skull through the vertex without intravenous contrast.  COMPARISON:  CT head dated 08/04/2012.  FINDINGS: Again noted is generalized brain atrophy with commensurate dilatation of the ventricles and sulci. The chronic small vessel ischemic changes within the periventricular and subcortical white matter appear stable. Old lacunar infarcts are noted within the basal ganglia. Again noted are chronic calcified atherosclerotic changes of the large vessels at the skull base.  There is no mass, hemorrhage, edema, or other evidence of acute parenchymal abnormality. No extra-axial hemorrhage.  Paranasal sinuses are clear. No acute osseous abnormality identified. Superficial soft tissues are unremarkable.  IMPRESSION: 1. Atrophy and chronic ischemic changes as detailed above. 2. No evidence of acute intracranial abnormality. No intracranial mass, hemorrhage, or edema.   Electronically Signed   By: Bary Richard M.D.   On: 08/18/2015 13:36    EKG:  Orders placed or performed during the hospital encounter of 08/16/15  . ED EKG  . ED EKG    ASSESSMENT AND PLAN:  Active Problems:   Atrial fibrillation with RVR (HCC)   Pressure ulcer 1. Atrial fibrillation with rapid ventricle response, continue oral Cardizem ,  heart rate is relatively well controlled at present. Patient is not on any anticoagulation due to risks of bleeding at this elderly age.  2. Aspiration bronchitis, resolved. Discontinue antibiotic therapy, follow clinically 3. Dysphagia. Speech therapist evaluated patient and recommended dysphagia diet . Following oral input 4. Urinary tract infection. Repeat urine cultures as initial cultures were apparently never sent to microbiology . Patient received 3 day therapy with Rocephin, discontinue Rocephin injections 5. Leukocytosis. Resolved with therapy 6. Metabolic acidosis likely due to poor perfusion with A. fib, RVR, also infection, resolved with therapy 7. History of stroke. Continue patient  on aspirin, changed to oral route , CT scan of head showed no acute changes 8. Encephalopathy, suspected due to urinary tract infection. Patient seemed to be more interactive overall. Possible discharge to skilled nursing facility tomorrow, if no new changes.  9. Pressure ulcers bilateral trochanter. Stage I, she was seen by Clydie Braun and recommended pink silicone foam daily changes as well as Prevalon boots. Unfortunately patient's oral intake is very poor and it's unlikely that patient's wounds are going to heal palliative care consultation was requested Management plans discussed with the patient, family and they are in agreement.   DRUG ALLERGIES: Not on File  CODE STATUS:     Code Status Orders        Start     Ordered   08/16/15 1425  Do not attempt resuscitation (DNR)   Continuous    Question Answer Comment  In the event of cardiac or respiratory ARREST Do not call a "code blue"   In the event of cardiac or respiratory ARREST Do not perform Intubation, CPR, defibrillation or ACLS   In the event of cardiac or respiratory ARREST Use medication by any route, position, wound care, and other measures to relive pain and suffering. May use oxygen, suction and manual treatment of airway obstruction as needed for comfort.      08/16/15 1424      TOTAL TIME TAKING CARE OF THIS PATIENT: .    Katharina Caper M.D on 08/19/2015 at 12:43 PM  Between 7am to 6pm - Pager - [phone]  After 6pm go to [email or web address] - password [email or web address] [facility]  [place] [facility]  Office  [phone]  CC: Primary care physician; Sula Rumple, MD

## 2015-08-19 NOTE — Progress Notes (Signed)
Received pt from [facility] awake but non-verbal, with a private sitter at bedside vs within limit. Made comfortable

## 2015-08-19 NOTE — Progress Notes (Signed)
Report called to Cammy Copa, RN on 2A.  Patient is alert at this time and eating dinner fed by Okey Regal, home aide. afib per cardiac monitor. VSS. Afebrile. Son at bedside with wife waiting for Dr. Orvan Falconer to come speak with them.

## 2015-08-19 NOTE — Progress Notes (Signed)
[facility] Encounter Note  Patient: Kristina [name] / Admit Date: 08/16/2015 / Date of Encounter: 08/19/2015, 7:28 AM   Subjective: Heart rate more controlled on lower doses of diltiazem and no current evidence of significant heart failure or true angina  Review of Systems:  Unable to assess due to dementia  Objective: Telemetry: Atrial fibrillation with controlled ventricular rate Physical Exam: Blood pressure 121/66, pulse 80, temperature 98.2 F (36.8 C), temperature source Oral, resp. rate 24, height  (1.727 m), weight 151 lb 10.8 oz (68.8 kg), SpO2 97 %. Body mass index is 23.07 kg/(m^2). General: Well developed, well nourished, in no acute distress. Head: Normocephalic, atraumatic, sclera non-icteric, no xanthomas, nares are without discharge. Neck: No apparent masses Lungs: Normal respirations with few diffuse wheezes, no rhonchi, no rales , no crackles   Heart: Irregular rate and rhythm, normal S1 soft S2, 3+ aortic murmur, no rub, no gallop, PMI is normal size and placement, carotid upstroke normal without bruit, jugular venous pressure normal Abdomen: Soft, non-tender, non-distended with normoactive bowel sounds. No hepatosplenomegaly. Abdominal aorta is normal size without bruit Extremities: Trace edema, no clubbing, no cyanosis, no ulcers,  Peripheral: 2+ radial, 2+ femoral, 2+ dorsal pedal pulses Neuro: Not Alert and oriented. Moves all extremities spontaneously. .   Intake/Output Summary (Last 24 hours) at 08/19/15 0728 Last data filed at 08/19/15 0700  Gross per 24 hour  Intake   3280 ml  Output    400 ml  Net   2880 ml    Inpatient Medications:  . antiseptic oral rinse  7 mL Mouth Rinse q12n4p  . aspirin  300 mg Rectal Daily  . cefTRIAXone (ROCEPHIN)  IV  1 g Intravenous Q24H  . chlorhexidine  15 mL Mouth Rinse BID  . diltiazem  60 mg Oral 3 times per day  . enoxaparin (LOVENOX) injection  40 mg Subcutaneous Q24H   Infusions:  .  sodium chloride 120 mL/hr at 08/18/15 2139  . diltiazem (CARDIZEM) infusion 5 mg/hr (08/19/15 0400)    Labs:  Recent Labs  08/16/15 1021  NA 143  K 4.0  CL 108  CO2 27  GLUCOSE 134*  BUN 22*  CREATININE 0.97  CALCIUM 8.8*  MG 2.6*    Recent Labs  08/16/15 1021  AST 32  ALT 25  ALKPHOS 82  BILITOT 1.1  PROT 7.1  ALBUMIN 3.3*    Recent Labs  08/16/15 1021  WBC 11.1*  NEUTROABS 8.8*  HGB 13.4  HCT 41.1  MCV 89.6  PLT 241    Recent Labs  08/16/15 1021  TROPONINI <0.03   Invalid input(s): POCBNP No results for input(s): HGBA1C in the last 72 hours.   Weights: Filed Weights   08/16/15 1600 08/17/15 0903 08/19/15 0500  Weight: 135 lb 5.8 oz (61.4 kg) 141 lb 1.5 oz (64 kg) 151 lb 10.8 oz (68.8 kg)     Radiology/Studies:  Ct Head Wo Contrast  08/18/2015   CLINICAL DATA:  6 old female with past medical history of stroke and aphasia presenting today with choking.  EXAM: CT HEAD WITHOUT CONTRAST  TECHNIQUE: Contiguous axial images were obtained from the base of the skull through the vertex without intravenous contrast.  COMPARISON:  CT head dated 08/04/2012.  FINDINGS: Again noted is generalized brain atrophy with commensurate dilatation of the ventricles and sulci. The chronic small vessel ischemic changes within the periventricular and subcortical white matter appear stable. Old lacunar infarcts are noted within the basal ganglia.  Again noted are chronic calcified atherosclerotic changes of the large vessels at the skull base.  There is no mass, hemorrhage, edema, or other evidence of acute parenchymal abnormality. No extra-axial hemorrhage.  Paranasal sinuses are clear. No acute osseous abnormality identified. Superficial soft tissues are unremarkable.  IMPRESSION: 1. Atrophy and chronic ischemic changes as detailed above. 2. No evidence of acute intracranial abnormality. No intracranial mass, hemorrhage, or edema.   Electronically Signed   By: Bary Richard M.D.   On: 08/18/2015 13:36   Dg Chest Port 1 View  08/16/2015   CLINICAL DATA:  Unresponsive.  Recent choking episode while eating  EXAM: PORTABLE CHEST 1 VIEW  COMPARISON:  June 05, 2014  FINDINGS: There is no edema or consolidation. There is stable central peribronchial thickening. Heart is mildly enlarged with pulmonary vascularity within normal limits. There is atherosclerotic calcification in the aorta. No adenopathy. There is degenerative change in each shoulder.  IMPRESSION: Stable mild cardiac enlargement. No edema or consolidation. Evidence of a degree of chronic bronchitis, stable.   Electronically Signed   By: Bretta Bang III M.D.   On: 08/16/2015 10:42     Assessment and Recommendation  81 y.o. female with acute illness complicated by acute onset of nonvalvular atrial fibrillation with rapid ventricular rate now improving with appropriate medication management 1. Continue diltiazem drip and wean off as able to take oral medication 2. Patient to take diltiazem 60 mg 3 times per day crushed in food for heart rate control of atrial fibrillation 3. No anticoagulation due to concerns of bleeding bruising and fall risk 4. No further cardiac diagnostics necessary at this time without evidence of myocardial infarction or congestive heart failure  Signed, Arnoldo Hooker M.D. FACC

## 2015-08-19 NOTE — Consult Note (Addendum)
Palliative Medicine Inpatient Consult Note   Name: Kristina [name] Date: 08/19/2015 MRN: [id number]  DOB: Jan 22, 1920  Referring Physician: Katharina Caper, MD  Palliative Care consult requested for this 81 y.o. female for goals of medical therapy in patient with a choking episode at time of admission. She was found to be in AFib with RVR and was started on a Cardizem drip.  Speech has evaluated pt and is recommending pureed with thickened liquids at this time.  Apparently, at home, the pt does not swallow well, but they wait a while and she eventually does move food to the back of her throat and does eventually swallow it. She is on thin liquids at home. She currently is getting rocephin for a UTI--but a urine cx was not done at adm and so is sent today.  Bld cxs are negative thus far (X 2days).  I am made aware at this time of some 'skipped beats' --about 7 of them since this am. She is off Cardizem drip as of 11 am today.  She is on oral Cardizem.   IMPRESSION: 1.  UTI  ---with leukocytosis 2.  AFib RVR ---now with some skipped beats / pauses  ---Cardizem drip is DCd and she is to get oral Cardizem 3. Aspiration bronchitis vs pneumonia 3.  Metabolic Acidosis-- resolving 4.  H/O CVA  ---with aphasia (nonverbal) ---with dysphagia ---choking at time of admission   5.  Pressure ulcers of bilateral trochanter Stage 1 6.  Pressure ulcer Left Hip 7 cm x 6 cm with deep tissue injury .5x .5 cm 7.  Pressure ulcer right Hipe 6.5 x 6 cm intact Stage 1 8.  Hypokalemia 9.  Hypernatremia  10.  Mild anemia of unclear etiology   TODAY'S DISCUSSIONS, DECISIONS, AND PLANS: 1.  Pt is DNR.  I have completed a PORTABLE Kristina [name] DNR form for the chart.  2.  She only has one son, Kristina [name], so obtaining copies of the HCPOA form is not essential (as it would be if there were other children). But, I have asked her son to bring the form up here anyway.    3.  I discussed pts dysphagia, feeding tube issues, and pts  heart rhythm issues at length by phone today.  Son says he 'PROBABLY would not want her to have a feeding tube'.  He feels her eating and swallowing will improve back to where it was once her UTI is treated.  She was a 'good eater' he says.  He reports that her aid, 'Kristina [name]', can get pt to eat when no one else can. Kristina [name] is a live in CNA and has been w/ pt for 3.5 years.  He says pt usually eats things like [address] Stew, Spaghetti-Os, and Oatmeal and hasn't been choking until this admission.   Pt ate pureed foods and thickened liquid only when 'Kristina [name]' came in and fed pt.  Pt ate about 25% at dinner (applesauce and a few bites of other foods).  I also heard pt coughing after eating confirming aspiration events. I discussed this with family in person in the evening when I returned here to meet family.  4.  I mentioned Hospice in the Home as an option.  I will have family provided with a 'choice of Hospice' form and will also check into what kind of surface pt can have with Hospice --family has pt in a Hospital Bed at her home but pt now has two hip decubiti.  Kristina [name] had been  taking pt on rides in a car twice a week, but that might not be a good idea with these hip decubiti ( Bucket seat pressure?)  Family is interested in [employer]      REVIEW OF SYSTEMS:  Patient is not able to provide ROS due to aphasia following stroke.   SPIRITUAL SUPPORT SYSTEM: Yes --family.  Pts husband just very recently died.   SOCIAL HISTORY:  reports that she has never smoked. She does not have any smokeless tobacco history on file.  LEGAL DOCUMENTS:  I have placed a [employer] portable DNR form in her paper chart.  No other documents are in record.   CODE STATUS: DNR  PAST MEDICAL HISTORY:No past medical history on file.  PAST SURGICAL HISTORY: No past surgical history on file.  ALLERGIES:  has no allergies on file.  MEDICATIONS:  Current Facility-Administered Medications  Medication Dose Route  Frequency Provider Last Rate Last Dose  . 0.9 %  sodium chloride infusion   Intravenous Continuous Kristina Nurse, MD 120 mL/hr at 08/18/15 2139    . acetaminophen (TYLENOL) tablet 650 mg  650 mg Oral Q6H PRN Kristina Nurse, MD       Or  . acetaminophen (TYLENOL) suppository 650 mg  650 mg Rectal Q6H PRN Kristina Nurse, MD      . antiseptic oral rinse (CPC / CETYLPYRIDINIUM CHLORIDE 0.05%) solution 7 mL  7 mL Mouth Rinse q12n4p Kristina Nurse, MD   7 mL at 08/19/15 1210  . aspirin chewable tablet 324 mg  324 mg Oral Daily Kristina Caper, MD   324 mg at 08/19/15 1056  . chlorhexidine (PERIDEX) 0.12 % solution 15 mL  15 mL Mouth Rinse BID Kristina Nurse, MD   15 mL at 08/19/15 1056  . diltiazem (CARDIZEM) 100 mg in dextrose 5 % 100 mL (1 mg/mL) infusion  5-15 mg/hr Intravenous Continuous Kristina Plant, MD   Stopped at 08/19/15 1100  . diltiazem (CARDIZEM) tablet 60 mg  60 mg Oral 3 times per day Kristina Blinks, MD   60 mg at 08/19/15 0831  . enoxaparin (LOVENOX) injection 40 mg  40 mg Subcutaneous Q24H Kristina Fulling, MD   40 mg at 08/19/15 1056    Vital Signs: BP 121/64 mmHg  Pulse 77  Temp(Src) 98.1 F (36.7 C) (Oral)  Resp 20  Ht  (1.727 m)  Wt 68.8 kg (151 lb 10.8 oz)  BMI 23.07 kg/m2  SpO2 98% Filed Weights   08/16/15 1600 08/17/15 0903 08/19/15 0500  Weight: 61.4 kg (135 lb 5.8 oz) 64 kg (141 lb 1.5 oz) 68.8 kg (151 lb 10.8 oz)    Estimated body mass index is 23.07 kg/(m^2) as calculated from the following:   Height as of this encounter:  (1.727 m).   Weight as of this encounter: 68.8 kg (151 lb 10.8 oz).  PERFORMANCE STATUS (ECOG) : 4 - Bedbound currently  PHYSICAL EXAM: Sleeping  Eyes closed No JVD or TM Heart rrr no mgr Lungs with decreased BS in bases Abd soft and NT Ext pale but w/o mottling of skin   LABS: CBC:    Component Value Date/Time   WBC 8.0 08/19/2015 0754   WBC 8.3 06/05/2014 0903   HGB 11.8* 08/19/2015 0754   HGB 12.9 06/05/2014  0903   HCT 35.1 08/19/2015 0754   HCT 39.2 06/05/2014 0903   PLT 208 08/19/2015 0754   PLT 263 06/05/2014 0903   MCV 90.2  08/19/2015 0754   MCV 91 06/05/2014 0903   NEUTROABS 8.8* 08/16/2015 1021   NEUTROABS 8.3* 09/11/2012 0443   LYMPHSABS 1.3 08/16/2015 1021   LYMPHSABS 1.8 09/11/2012 0443   MONOABS 0.9 08/16/2015 1021   MONOABS 1.0* 09/11/2012 0443   EOSABS 0.0 08/16/2015 1021   EOSABS 0.3 09/11/2012 0443   BASOSABS 0.1 08/16/2015 1021   BASOSABS 0.1 09/11/2012 0443   Comprehensive Metabolic Panel:    Component Value Date/Time   NA 148* 08/19/2015 0754   NA 138 06/05/2014 0903   K 3.0* 08/19/2015 0754   K 2.9* 06/05/2014 0903   CL 120* 08/19/2015 0754   CL 100 06/05/2014 0903   CO2 22 08/19/2015 0754   CO2 31 06/05/2014 0903   BUN 13 08/19/2015 0754   BUN 11 06/05/2014 0903   CREATININE 0.72 08/19/2015 0754   CREATININE 1.06 06/05/2014 0903   GLUCOSE 111* 08/19/2015 0754   GLUCOSE 116* 06/05/2014 0903   CALCIUM 8.0* 08/19/2015 0754   CALCIUM 8.7 06/05/2014 0903   AST 32 08/16/2015 1021   AST 14* 06/05/2014 0903   ALT 25 08/16/2015 1021   ALT 19 06/05/2014 0903   ALKPHOS 82 08/16/2015 1021   ALKPHOS 103 06/05/2014 0903   BILITOT 1.1 08/16/2015 1021   BILITOT 0.5 06/05/2014 0903   PROT 7.1 08/16/2015 1021   PROT 8.2 06/05/2014 0903   ALBUMIN 3.3* 08/16/2015 1021   ALBUMIN 3.4 06/05/2014 0903    More than 50% of the visit was spent in counseling/coordination of care: Yes  Time Spent: 80 minutes

## 2015-08-20 ENCOUNTER — Inpatient Hospital Stay: Payer: Medicare Other

## 2015-08-20 ENCOUNTER — Encounter: Payer: Self-pay | Admitting: Radiology

## 2015-08-20 DIAGNOSIS — I2699 Other pulmonary embolism without acute cor pulmonale: Secondary | ICD-10-CM

## 2015-08-20 LAB — BASIC METABOLIC PANEL
ANION GAP: 5 (ref 5–15)
BUN: 10 mg/dL (ref 6–20)
CALCIUM: 8.2 mg/dL — AB (ref 8.9–10.3)
CO2: 22 mmol/L (ref 22–32)
CREATININE: 0.59 mg/dL (ref 0.44–1.00)
Chloride: 119 mmol/L — ABNORMAL HIGH (ref 101–111)
Glucose, Bld: 96 mg/dL (ref 65–99)
Potassium: 3.3 mmol/L — ABNORMAL LOW (ref 3.5–5.1)
Sodium: 146 mmol/L — ABNORMAL HIGH (ref 135–145)

## 2015-08-20 MED ORDER — DILTIAZEM HCL 25 MG/5ML IV SOLN
15.0000 mg | Freq: Once | INTRAVENOUS | Status: AC
  Administered 2015-08-20: 15 mg via INTRAVENOUS

## 2015-08-20 MED ORDER — DILTIAZEM HCL 25 MG/5ML IV SOLN
INTRAVENOUS | Status: AC
  Filled 2015-08-20: qty 5

## 2015-08-20 MED ORDER — IOHEXOL 350 MG/ML SOLN
75.0000 mL | Freq: Once | INTRAVENOUS | Status: AC | PRN
  Administered 2015-08-20: 75 mL via INTRAVENOUS

## 2015-08-20 MED ORDER — APIXABAN 5 MG PO TABS: 5.0000 mg | ORAL_TABLET | Freq: Two times a day (BID) | ORAL | Status: DC

## 2015-08-20 MED ORDER — DILTIAZEM HCL 60 MG PO TABS
60.0000 mg | ORAL_TABLET | Freq: Four times a day (QID) | ORAL | Status: DC
  Administered 2015-08-20 – 2015-08-21 (×5): 60 mg via ORAL
  Filled 2015-08-20 (×5): qty 1

## 2015-08-20 MED ORDER — DILTIAZEM HCL 60 MG PO TABS
30.0000 mg | ORAL_TABLET | Freq: Four times a day (QID) | ORAL | Status: DC
  Administered 2015-08-20: 30 mg via ORAL
  Filled 2015-08-20: qty 1

## 2015-08-20 MED ORDER — DEXTROSE 5 % IV SOLN
INTRAVENOUS | Status: AC
  Administered 2015-08-20: 15:00:00 via INTRAVENOUS

## 2015-08-20 MED ORDER — APIXABAN 5 MG PO TABS
10.0000 mg | ORAL_TABLET | Freq: Two times a day (BID) | ORAL | Status: DC
  Administered 2015-08-20 – 2015-08-21 (×2): 10 mg via ORAL
  Filled 2015-08-20 (×2): qty 2

## 2015-08-20 NOTE — Care Management Note (Addendum)
Case Management Note  Patient Details  Name: Kristina [name] Date of Birth: 1920/08/09  Subjective/Objective:   Admitted with choking episode with hx of aphasia, stroke, dementia and multiple pressure ulcers.  Found to be in Afib with  RVR. Required IV Cardizem, now controlled with po Cardizem. UTI with IV antibiotics. Palliative consult reviewed. RNCM received consult for hospice at home. Patient lives at home and has 24 hour care givers. Attempted to contact son, Tessla Spurling at both contact numbers without success. Ms. Kristina [name], patient's live in caregiver is at bedside. She provides 24 hour care.  RNCM left card and contact information for son to call me regarding hospice provider options.               Action/Plan: Home with hospice services.   Expected Discharge Date:                  Expected Discharge Plan:  Home w Hospice Care  In-House Referral:     Discharge planning Services  CM Consult  Post Acute Care Choice:  Hospice Choice offered to:     DME Arranged:    DME Agency:     HH Arranged:    HH Agency:     Status of Service:  In process, will continue to follow  Medicare Important Message Given:  Yes-second notification given Date Medicare IM Given:    Medicare IM give by:    Date Additional Medicare IM Given:    Additional Medicare Important Message give by:     If discussed at Long Length of Stay Meetings, dates discussed:    Additional Comments:  Marily Memos, RN 08/20/2015, 9:19 AM

## 2015-08-20 NOTE — Progress Notes (Addendum)
Palliative Medicine Inpatient Consult Follow Up Note   Name: Kristina [name] Date: 08/20/2015 MRN: [id number]  DOB: [birth year]  Referring Physician: Katharina Caper, MD  Palliative Care consult requested for this 81 y.o. female for goals of medical therapy in patient who presented with a choking episode.  She has been found to have significant dysphagia, some early decubiti (with some deep tissue injury), and Afib with RVR.  Today, small pulmonary emboli were seen on CT of chest.    ______________________________________________________________________________________  IMPRESSION: 1. UTI  ---with leukocytosis 2. AFib RVR ---now with some skipped beats / pauses  ---Cardizem drip is DCd and she is to get oral Cardizem 3. Aspiration bronchitis vs pneumonia 3. Metabolic Acidosis-- resolving 4. H/O CVA  ---with aphasia (nonverbal) ---with dysphagia ---choking at time of admission  5. Pressure ulcers of bilateral trochanter Stage 1 6. Pressure ulcer Left Hip 7 cm x 6 cm with deep tissue injury .5x .5 cm 7. Pressure ulcer right Hipe 6.5 x 6 cm intact Stage 1 8. Hypokalemia 9. Hypernatremia  10. Mild anemia of unclear etiology  11.  Pulmonary emboli seen on CT today   TODAY'S DISCUSSIONS AND DECISIONS: I spoke with Hospice Liaison, Dayna Barker, and am glad that things are working out for pt to have Hospice Care in the Home setting. A low air loss mattress / hospital bed is being delivered tonite and pt is expected to go home tomorrow.  She is started on Eliquis for the PEs.  I spoke with Okey Regal, pt's live-in home aide, and she had a lot of questions about the care of pts skin.  I have asked her to talk with the Hospice nurses when they come out as they will be able to address most of her questions on site.  (she wanted to know where to buy certain skin products etc --most of this will be handled by Hospice).  Family is aware that she is declining and meets criteria that puts  her in a category for those who have less than 6 months to live (due to CVA late effects).  But, each person is different and she may outlive that time period, especially with good in-home care (by the live-in aide) which is now to be augmented with Hospice care.    ____________________________________________________________________________________  REVIEW OF SYSTEMS:  Patient is not able to provide ROS due to nonverbal state and advanced dementia.  Pt's aide, Okey Regal, is bedside and reported that pt ate well for her today.    CODE STATUS: DNR   PAST MEDICAL HISTORY:History reviewed. No pertinent past medical history.  PAST SURGICAL HISTORY: No past surgical history on file.  Vital Signs: BP 144/82 mmHg  Pulse 114  Temp(Src) 98.3 F (36.8 C) (Oral)  Resp 18  Ht  (1.727 m)  Wt 68.8 kg (151 lb 10.8 oz)  BMI 23.07 kg/m2  SpO2 97% Filed Weights   08/16/15 1600 08/17/15 0903 08/19/15 0500  Weight: 61.4 kg (135 lb 5.8 oz) 64 kg (141 lb 1.5 oz) 68.8 kg (151 lb 10.8 oz)    Estimated body mass index is 23.07 kg/(m^2) as calculated from the following:   Height as of this encounter:  (1.727 m).   Weight as of this encounter: 68.8 kg (151 lb 10.8 oz).  PHYSICAL EXAM:   LABS: CBC:    Component Value Date/Time   WBC 8.0 08/19/2015 0754   WBC 8.3 06/05/2014 0903   HGB 11.8* 08/19/2015 0754   HGB 12.9 06/05/2014 0903  HCT 35.1 08/19/2015 0754   HCT 39.2 06/05/2014 0903   PLT 208 08/19/2015 0754   PLT 263 06/05/2014 0903   MCV 90.2 08/19/2015 0754   MCV 91 06/05/2014 0903   NEUTROABS 8.8* 08/16/2015 1021   NEUTROABS 8.3* 09/11/2012 0443   LYMPHSABS 1.3 08/16/2015 1021   LYMPHSABS 1.8 09/11/2012 0443   MONOABS 0.9 08/16/2015 1021   MONOABS 1.0* 09/11/2012 0443   EOSABS 0.0 08/16/2015 1021   EOSABS 0.3 09/11/2012 0443   BASOSABS 0.1 08/16/2015 1021   BASOSABS 0.1 09/11/2012 0443   Comprehensive Metabolic Panel:    Component Value Date/Time   NA 146* 08/20/2015  0438   NA 138 06/05/2014 0903   K 3.3* 08/20/2015 0438   K 2.9* 06/05/2014 0903   CL 119* 08/20/2015 0438   CL 100 06/05/2014 0903   CO2 22 08/20/2015 0438   CO2 31 06/05/2014 0903   BUN 10 08/20/2015 0438   BUN 11 06/05/2014 0903   CREATININE 0.59 08/20/2015 0438   CREATININE 1.06 06/05/2014 0903   GLUCOSE 96 08/20/2015 0438   GLUCOSE 116* 06/05/2014 0903   CALCIUM 8.2* 08/20/2015 0438   CALCIUM 8.7 06/05/2014 0903   AST 32 08/16/2015 1021   AST 14* 06/05/2014 0903   ALT 25 08/16/2015 1021   ALT 19 06/05/2014 0903   ALKPHOS 82 08/16/2015 1021   ALKPHOS 103 06/05/2014 0903   BILITOT 1.1 08/16/2015 1021   BILITOT 0.5 06/05/2014 0903   PROT 7.1 08/16/2015 1021   PROT 8.2 06/05/2014 0903   ALBUMIN 3.3* 08/16/2015 1021   ALBUMIN 3.4 06/05/2014 0903    REFERRALS TO BE ORDERED:  [facility] involved   More than 50% of the visit was spent in counseling/coordination of care: YES  Time Spent: 25 min

## 2015-08-20 NOTE — Progress Notes (Signed)
Pt. Has now voided. Will continue to monitor.

## 2015-08-20 NOTE — Progress Notes (Signed)
[facility] - [place] [facility]   PATIENT NAME: Kristina [name]    MR#:  [id number]  DATE OF BIRTH:  [birth year]  SUBJECTIVE:  CHIEF COMPLAINT:   Chief Complaint  Patient presents with  . Choking   patient is 81 year old Caucasian female with past medical history of stroke and aphasia who presents to the hospital was choking. In emergency room, she was noted to be in A. fib, RVR with rates of 170. Patient was given Cardizem IV bolus and then placed on IV drip. Patient was evaluated by speech therapist and recommended dysphagia diet. She was initiated on Cardizem orally. Heart rate is well controlled on oral medications. Urine cultures apparently were never sent, urine cultures Fourth of October 2016 pending. Oral intake has some improved, per caregiver. The patient will be discharged home with hospice services. Sodium level has improved since yesterday, but still remains high at 146. Heart rate remains high at 110s to 130s, and was 160 today early in the morning. Cardizem has been advanced to every 8 hours  Review of Systems  Unable to perform ROS: patient nonverbal    VITAL SIGNS: Blood pressure 144/82, pulse 114, temperature 98.3 F (36.8 C), temperature source Oral, resp. rate 18, height  (1.727 m), weight 68.8 kg (151 lb 10.8 oz), SpO2 97 %.  PHYSICAL EXAMINATION:   GENERAL:  81 y.o.-year-old patient lying in the bed with no acute distress. Nonverbal EYES: Pupils equal, round, reactive to light and accommodation. No scleral icterus. Extraocular muscles intact.  HEENT: Head atraumatic, normocephalic. Oropharynx and nasopharynx clear.  NECK:  Supple, no jugular venous distention. No thyroid enlargement, no tenderness.  LUNGS: Normal breath sounds bilaterally, no wheezing, numerous bilateral rhonchi, rales,  , but no crepitations were heard today.  Intermittently using accessory muscles of respiration, but not in respiratory distress, seems to be comfortable.  Intermittent dry cough.  CARDIOVASCULAR: S1, S2 , tachycardic, irregularly irregular. No murmurs, rubs, or gallops.  ABDOMEN: Soft, nontender, nondistended. Bowel sounds present. No organomegaly or mass.  EXTREMITIES: No pedal edema, cyanosis, or clubbing.  NEUROLOGIC: Cranial nerves II through XII are intact. Muscle strength 5/5 in all extremities. Sensation intact. Gait not checked.  PSYCHIATRIC: The patient is alert , not able to assess her orientation as patient is nonverbal  SKIN: No obvious rash, lesion, or ulcer.   ORDERS/RESULTS REVIEWED:   CBC  Recent Labs Lab 08/16/15 1021 08/19/15 0754  WBC 11.1* 8.0  HGB 13.4 11.8*  HCT 41.1 35.1  PLT 241 208  MCV 89.6 90.2  MCH 29.3 30.2  MCHC 32.7 33.5  RDW 15.9* 16.1*  LYMPHSABS 1.3  --   MONOABS 0.9  --   EOSABS 0.0  --   BASOSABS 0.1  --    ------------------------------------------------------------------------------------------------------------------  Chemistries   Recent Labs Lab 08/16/15 1021 08/19/15 0754 08/20/15 0438  NA 143 148* 146*  K 4.0 3.0* 3.3*  CL 108 120* 119*  CO2 GLUCOSE 134* 111* 96  BUN 22* 13 10  CREATININE 0.97 0.72 0.59  CALCIUM 8.8* 8.0* 8.2*  MG 2.6* 2.1  --   AST 32  --   --   ALT 25  --   --   ALKPHOS 82  --   --   BILITOT 1.1  --   --    ------------------------------------------------------------------------------------------------------------------ estimated creatinine clearance is 44.3 mL/min (by C-G formula based on Cr of 0.59). ------------------------------------------------------------------------------------------------------------------ No results for input(s): TSH, T4TOTAL, T3FREE, THYROIDAB in  the last 72 hours.  Invalid input(s): FREET3  Cardiac Enzymes  Recent Labs Lab 08/16/15 1021  TROPONINI <0.03   ------------------------------------------------------------------------------------------------------------------ Invalid input(s):  POCBNP ---------------------------------------------------------------------------------------------------------------  RADIOLOGY: No results found.  EKG:  Orders placed or performed during the hospital encounter of 08/16/15  . ED EKG  . ED EKG    ASSESSMENT AND PLAN:  Active Problems:   Atrial fibrillation with RVR (HCC)   Pressure ulcer 1. Atrial fibrillation with rapid ventricle response, advance oral Cardizem to every 8 hours. Following patient's blood pressure and heart rate closely ,  heart rate is still poorly controlled  at present. Patient is not on any anticoagulation due to risks of bleeding at this elderly age. Patient is not hypoxic, but probability for primary embolism is high, we will get CT scan of the chest done to rule out PE.  2. Aspiration bronchitis/atelectasis, no obvious pneumonia since patient is afebrile and her white blood cell count is normal, getting CT scan of the chest to rule out aspiration pneumonitis and pulmonary embolism. Of antibiotic therapy at present,  follow clinically 3. Dysphagia. Speech therapist evaluated patient and recommended dysphagia diet . Improving oral input 4. Urinary tract infection. Awaiting for urine cultures as initial cultures were apparently never sent to microbiology . Patient received 3 day therapy with Rocephin, now off  Rocephin injections 5. Leukocytosis. Resolved with therapy 6. Metabolic acidosis likely due to poor perfusion with A. fib, RVR, also infection, resolved with therapy 7. History of stroke. Continue patient on aspirin, changed to oral route , CT scan of head showed no acute changes 8. Encephalopathy, suspected due to urinary tract infection versus A. fib, RVR. Patient seemed to be more interactive overall. Possible discharge to home with hospice services, likely tomorrow if no other changes.  9. Pressure ulcers bilateral trochanter. Stage I, she was seen by Clydie Braun and recommended pink silicone foam daily changes as  well as Prevalon boots. Unfortunately patient's oral intake is very poor and it's unlikely that patient's wounds are going to heal,  palliative care consultation was requested, appreciated. Patient will be discharged home with hospice services 10. Hypernatremia. Discontinue normal saline. Continue D5 water, follow-up sodium level in the morning Management plans discussed with the patient, family and they are in agreement.   DRUG ALLERGIES: Not on File  CODE STATUS:     Code Status Orders        Start     Ordered   08/16/15 1425  Do not attempt resuscitation (DNR)   Continuous    Question Answer Comment  In the event of cardiac or respiratory ARREST Do not call a "code blue"   In the event of cardiac or respiratory ARREST Do not perform Intubation, CPR, defibrillation or ACLS   In the event of cardiac or respiratory ARREST Use medication by any route, position, wound care, and other measures to relive pain and suffering. May use oxygen, suction and manual treatment of airway obstruction as needed for comfort.      08/16/15 1424      TOTAL TIME TAKING CARE OF THIS PATIENT: 40 minutes.    Katharina Caper M.D on 08/20/2015 at 2:28 PM  Between 7am to 6pm - Pager - [phone]  After 6pm go to [email or web address] - password [email or web address] [facility]  [facility]  Office  [phone]  CC: Primary care physician; Sula Rumple, MD

## 2015-08-20 NOTE — Consult Note (Signed)
WOC wound consult note Reason for Consult:Transferred to telemetry unit.  Ordering Sizewise alternate mattress replacement with low air loss feature.  Left and right trochanter remains intact.  WOC team will continue to follow and remain available to patient.  Maple Hudson RN BSN [facility] Pager [phone]

## 2015-08-20 NOTE — Progress Notes (Signed)
ANTICOAGULATION CONSULT NOTE - Initial Consult  Pharmacy Consult for Apixaban  Indication: pulmonary embolus  Not on File  Patient Measurements: Height:  (172.7 cm) Weight: 151 lb 10.8 oz (68.8 kg) IBW/kg (Calculated) : 63.9 Heparin Dosing Weight:   Vital Signs: Temp: 98.3 F (36.8 C) (10/05 1200) Temp Source: Oral (10/05 0520) BP: 144/82 mmHg (10/05 1200) Pulse Rate: 114 (10/05 1200)  Labs:  Recent Labs  08/19/15 0754 08/20/15 0438  HGB 11.8*  --   HCT 35.1  --   PLT 208  --   CREATININE 0.72 0.59    Estimated Creatinine Clearance: 44.3 mL/min (by C-G formula based on Cr of 0.59).   Medical History: History reviewed. No pertinent past medical history.  Medications:  Prescriptions prior to admission  Medication Sig Dispense Refill Last Dose  . diltiazem (DILACOR XR) 180 MG 24 hr capsule Take 180 mg by mouth daily.   unknown  . furosemide (LASIX) 80 MG tablet Take 80 mg by mouth daily.   unknown  . potassium chloride SA (K-DUR,KLOR-CON) 20 MEQ tablet Take 20 mEq by mouth 2 (two) times daily.   unknown    Assessment: Pharmacy consulted to dose apixaban in this 81 year old female admitted with probable PE. Pt received a dose of Lovenox 40 mg SQ X 1 on 10/5 @ 9:00.   Goal of Therapy:  Resolution of PE   Plan:  Will order Apixaban 10 mg PO BID X 7 to start 10/05 @ 16:30. Will order Apixaban 5 mg PO BID to start 10/12 @ 22:00.  Beldon Nowling D 08/20/2015,4:16 PM

## 2015-08-20 NOTE — Progress Notes (Addendum)
New referral received for Hospice and Palliative Care services at home after discharge from [facility] Kristina [name] following a Palliative Medicine consult.  Patient to the [facility] ED for evaluation after choking at home. She was found to be in afib with RVR and placed on a  cardizem drip, now on oral dose with good rate control. She has also received IV antibiotics for treatment of a suspected urinary tract infection. Patient seen with paid caregiver Kristina [name] present. Patient was alert, nonverbal/aphasic d/t past CVA, appeared comfortable. Lunch tray at bedside, patient is on a pureed diet with thickened liquids. Kristina [name] voiced her understanding of aspiration precautions. Patient is currently incontinent of bowel and bladder, foley catheter discontinued on 10/4. Appetite poor. She remains on IVF. Per wound care consult notes patient has two deep tissue injuries and a mattress with a low air loss feature has been ordered.   Writer left a message for patient's son Kristina [name] to discuss services. Patient will need a hospital bed with the soft system mattress. This will need to be in place prior to discharge. Patient will require EMS transport at discharge and she is nonambulatory. CMRN Misty Stanley and staff RN Hansel Starling made aware. Thank you.  2:00 pm UPDATE: Writer spoke with patient's son Kristina [name] via phone call [phone]).Education initiated regrading hospice services, philosophy and team approach to care with good understanding voiced. Kristina [name] has requested a hospital bed and patient requires a soft system mattress d/t her skin/wound issues. Address and contact numbers confirmed with Kristina [name]. DME ordered and patient information faxed to referral.  Plan is for DME to be delivered by this evening as Kristina [name] is not available tomorrow until after 12 pm for delivery. Patient does need to have the bed in place prior to discharge.  Dayna Barker RN, BSN [facility] [place], [facility] [phone] c

## 2015-08-20 NOTE — Progress Notes (Signed)
Pt. Foley was removed at 6:30pm. Pt. Did not void so I bladder scanned her she was holding 78cc. Md notified and order was put in for a in and out cath at 03:00. Will Monitor.

## 2015-08-20 NOTE — Progress Notes (Signed)
Initial Nutrition Assessment   INTERVENTION:   Meals and Snacks: Cater to patient preferences per SLP recommendations. RD notes pt is on pudding thick liquids. Medical Food Supplement Therapy: recommend Magic Cup TID   NUTRITION DIAGNOSIS:   Swallowing difficulty related to dysphagia as evidenced by  (current diet order, SLP following).  GOAL:   Patient will meet greater than or equal to 90% of their needs  MONITOR:    (Energy Intake, Anthropometrics, Digestive system, Electrolyte and Renal Profile)  REASON FOR ASSESSMENT:    (Pudding Thick Liquids)    ASSESSMENT:   Pt admitted with afib, and aspiration bronchitis versus pna. Pt with stage III pressure ulcer.    Noted per MD note, pt with dysphagia, discussion regarding PEG placement; per MD note, pt family reports pt would probably not want.  PMHx: stroke, aphasia   Diet Order:  DIET - DYS 1 Room service appropriate?: No; Fluid consistency:: Pudding Thick    Current Nutrition: Pt sitter reports pt ate 100% of breakfast this am except oatmeal and that pt tolerated well. Recorded po intake 0% of meals yesterday.  Food/Nutrition-Related History: Pt personal sitter reports pt has a very good appetite PTA. Per MD note, pt usually eats brunswick stew, spaghetti O's, oatmeal PTA.   Medications: NS at 128mL/hr  Electrolyte/Renal Profile and Glucose Profile:   Recent Labs Lab 08/16/15 1021 08/19/15 0754 08/20/15 0438  NA 143 148* 146*  K 4.0 3.0* 3.3*  CL 108 120* 119*  CO2 BUN 22* 13 10  CREATININE 0.97 0.72 0.59  CALCIUM 8.8* 8.0* 8.2*  MG 2.6* 2.1  --   GLUCOSE 134* 111* 96   Protein Profile:  Recent Labs Lab 08/16/15 1021  ALBUMIN 3.3*    Gastrointestinal Profile: Last BM:  08/20/2015   Nutrition-Focused Physical Exam Findings:  Unable to complete Nutrition-Focused physical exam at this time.    Weight Change: Pt personal sitter report's pt weight has been stable PTA. Per CH[facility] weight  gain possible.  Filed Weights   08/16/15 1600 08/17/15 0903 08/19/15 0500  Weight: 135 lb 5.8 oz (61.4 kg) 141 lb 1.5 oz (64 kg) 151 lb 10.8 oz (68.8 kg)    Skin:   (Stage III sacral pressure ulcer, Stage I on left and right hip pressure ulcer)   Height:   Ht Readings from Last 1 Encounters:  08/16/15  (1.727 m)    Weight:   Wt Readings from Last 1 Encounters:  08/19/15 151 lb 10.8 oz (68.8 kg)    BMI:  Body mass index is 23.07 kg/(m^2).     Estimated Nutritional Needs:   Kcal:  BEE: 1142kcals, TEE: (IF 1.2-1.4)(AF 1.2) 1644-1929kcals  Protein:  82-103g protein (1.2-1.5g/kg)  Fluid:  1720-2042mL of fluid (25-67mL/kg)  EDUCATION NEEDS:   No education needs identified at this time    HIGH Care Level  Leda Quail, RD, LDN Pager [phone]

## 2015-08-20 NOTE — Care Management Note (Signed)
Case Management Note  Patient Details  Name: Kristina [name] MRN: [id number] Date of Birth: [birth year]  Subjective/Objective:    TC received from [facility]. He is agreeable to hospice at home. Provide a list of agencies and his preference is [facility].  Referral to [facility] with Hospice.              Action/Plan: Home with [facility]  Expected Discharge Date:                  Expected Discharge Plan:  Home w Hospice Care  In-House Referral:     Discharge planning Services  CM Consult  Post Acute Care Choice:  Hospice Choice offered to:     DME Arranged:    DME Agency:     HH Arranged:    HH Agency:     Status of Service:  In process, will continue to follow  Medicare Important Message Given:  Yes-second notification given Date Medicare IM Given:    Medicare IM give by:    Date Additional Medicare IM Given:    Additional Medicare Important Message give by:     If discussed at Long Length of Stay Meetings, dates discussed:    Additional Comments:  Marily Memos, RN 08/20/2015, 11:24 AM

## 2015-08-20 NOTE — Progress Notes (Signed)
[facility] Cardiology [facility] Encounter Note  Patient: Kristina [name] / Admit Date: 08/16/2015 / Date of Encounter: 08/20/2015, 7:59 AM   Subjective: Heart rate more controlled on lower doses of diltiazem and no current evidence of significant heart failure or true angina Occasional patient had some episodes of slower heart rate with higher doses of diltiazem  Review of Systems:  Unable to assess due to dementia  Objective: Telemetry: Atrial fibrillation with controlled ventricular rate Physical Exam: Blood pressure 144/78, pulse 108, temperature 97.9 F (36.6 C), temperature source Oral, resp. rate 20, height  (1.727 m), weight 151 lb 10.8 oz (68.8 kg), SpO2 97 %. Body mass index is 23.07 kg/(m^2). General: Well developed, well nourished, in no acute distress. Head: Normocephalic, atraumatic, sclera non-icteric, no xanthomas, nares are without discharge. Neck: No apparent masses Lungs: Normal respirations with few diffuse wheezes, no rhonchi, no rales , no crackles   Heart: Irregular rate and rhythm, normal S1 soft S2, 3+ aortic murmur, no rub, no gallop, PMI is normal size and placement, carotid upstroke normal without bruit, jugular venous pressure normal Abdomen: Soft, non-tender, non-distended with normoactive bowel sounds. No hepatosplenomegaly. Abdominal aorta is normal size without bruit Extremities: Trace edema, no clubbing, no cyanosis, no ulcers,  Peripheral: 2+ radial, 2+ femoral, 2+ dorsal pedal pulses Neuro: Not Alert and oriented. Moves all extremities spontaneously. .   Intake/Output Summary (Last 24 hours) at 08/20/15 0759 Last data filed at 08/20/15 0300  Gross per 24 hour  Intake   1850 ml  Output    265 ml  Net   1585 ml    Inpatient Medications:  . antiseptic oral rinse  7 mL Mouth Rinse q12n4p  . aspirin  324 mg Oral Daily  . chlorhexidine  15 mL Mouth Rinse BID  . diltiazem  60 mg Oral Q12H  . enoxaparin (LOVENOX) injection  40 mg Subcutaneous  Q24H   Infusions:  . sodium chloride 120 mL/hr at 08/20/15 0216    Labs:  Recent Labs  08/19/15 0754  NA 148*  K 3.0*  CL 120*  CO2 22  GLUCOSE 111*  BUN 13  CREATININE 0.72  CALCIUM 8.0*  MG 2.1   No results for input(s): AST, ALT, ALKPHOS, BILITOT, PROT, ALBUMIN in the last 72 hours.  Recent Labs  08/19/15 0754  WBC 8.0  HGB 11.8*  HCT 35.1  MCV 90.2  PLT 208   No results for input(s): CKTOTAL, CKMB, TROPONINI in the last 72 hours. Invalid input(s): POCBNP No results for input(s): HGBA1C in the last 72 hours.   Weights: Filed Weights   08/16/15 1600 08/17/15 0903 08/19/15 0500  Weight: 135 lb 5.8 oz (61.4 kg) 141 lb 1.5 oz (64 kg) 151 lb 10.8 oz (68.8 kg)     Radiology/Studies:  Ct Head Wo Contrast  08/18/2015   CLINICAL DATA:  89 old female with past medical history of stroke and aphasia presenting today with choking.  EXAM: CT HEAD WITHOUT CONTRAST  TECHNIQUE: Contiguous axial images were obtained from the base of the skull through the vertex without intravenous contrast.  COMPARISON:  CT head dated 08/04/2012.  FINDINGS: Again noted is generalized brain atrophy with commensurate dilatation of the ventricles and sulci. The chronic small vessel ischemic changes within the periventricular and subcortical white matter appear stable. Old lacunar infarcts are noted within the basal ganglia. Again noted are chronic calcified atherosclerotic changes of the large vessels at the skull base.  There is no mass, hemorrhage, edema,  or other evidence of acute parenchymal abnormality. No extra-axial hemorrhage.  Paranasal sinuses are clear. No acute osseous abnormality identified. Superficial soft tissues are unremarkable.  IMPRESSION: 1. Atrophy and chronic ischemic changes as detailed above. 2. No evidence of acute intracranial abnormality. No intracranial mass, hemorrhage, or edema.   Electronically Signed   By: Bary Richard M.D.   On: 08/18/2015 13:36   Dg Chest  Port 1 View  08/16/2015   CLINICAL DATA:  Unresponsive.  Recent choking episode while eating  EXAM: PORTABLE CHEST 1 VIEW  COMPARISON:  June 05, 2014  FINDINGS: There is no edema or consolidation. There is stable central peribronchial thickening. Heart is mildly enlarged with pulmonary vascularity within normal limits. There is atherosclerotic calcification in the aorta. No adenopathy. There is degenerative change in each shoulder.  IMPRESSION: Stable mild cardiac enlargement. No edema or consolidation. Evidence of a degree of chronic bronchitis, stable.   Electronically Signed   By: Bretta Bang III M.D.   On: 08/16/2015 10:42     Assessment and Recommendation  81 y.o. female with acute illness complicated by acute onset of nonvalvular atrial fibrillation with rapid ventricular rate now improving with appropriate medication management 1. Continue diltiazem short acting which can be crushed into her meals at 60 mg 3 times per day and adjust as necessary for heart rate control below 110 bpm 2. Consider palliative care of with treatment listed above  3. No anticoagulation due to concerns of bleeding bruising and fall risk 4. No further cardiac diagnostics necessary at this time without evidence of myocardial infarction or congestive heart failure 5. Call if further questions  Signed, Arnoldo Hooker M.D. FACC

## 2015-08-21 DIAGNOSIS — B965 Pseudomonas (aeruginosa) (mallei) (pseudomallei) as the cause of diseases classified elsewhere: Secondary | ICD-10-CM

## 2015-08-21 DIAGNOSIS — N390 Urinary tract infection, site not specified: Secondary | ICD-10-CM

## 2015-08-21 DIAGNOSIS — E872 Acidosis, unspecified: Secondary | ICD-10-CM

## 2015-08-21 DIAGNOSIS — J810 Acute pulmonary edema: Secondary | ICD-10-CM

## 2015-08-21 DIAGNOSIS — G9341 Metabolic encephalopathy: Secondary | ICD-10-CM

## 2015-08-21 DIAGNOSIS — E870 Hyperosmolality and hypernatremia: Secondary | ICD-10-CM

## 2015-08-21 DIAGNOSIS — R532 Functional quadriplegia: Secondary | ICD-10-CM

## 2015-08-21 DIAGNOSIS — I509 Heart failure, unspecified: Secondary | ICD-10-CM

## 2015-08-21 DIAGNOSIS — R1310 Dysphagia, unspecified: Secondary | ICD-10-CM

## 2015-08-21 DIAGNOSIS — D72829 Elevated white blood cell count, unspecified: Secondary | ICD-10-CM

## 2015-08-21 LAB — BASIC METABOLIC PANEL
Anion gap: 8 (ref 5–15)
BUN: 7 mg/dL (ref 6–20)
CHLORIDE: 112 mmol/L — AB (ref 101–111)
CO2: 21 mmol/L — ABNORMAL LOW (ref 22–32)
Calcium: 8.2 mg/dL — ABNORMAL LOW (ref 8.9–10.3)
Creatinine, Ser: 0.6 mg/dL (ref 0.44–1.00)
GFR calc Af Amer: >60 mL/min (ref >60)
GFR calc non Af Amer: >60 mL/min (ref >60)
Glucose, Bld: 110 mg/dL — ABNORMAL HIGH (ref 65–99)
POTASSIUM: 3.3 mmol/L — AB (ref 3.5–5.1)
SODIUM: 141 mmol/L (ref 135–145)

## 2015-08-21 LAB — CULTURE, BLOOD (ROUTINE X 2)
CULTURE: NO GROWTH
Culture: NO GROWTH

## 2015-08-21 LAB — URINE CULTURE: SPECIAL REQUESTS: NORMAL

## 2015-08-21 MED ORDER — CIPROFLOXACIN HCL 250 MG PO TABS
250.0000 mg | ORAL_TABLET | Freq: Two times a day (BID) | ORAL | Status: DC
  Administered 2015-08-21: 250 mg via ORAL
  Filled 2015-08-21: qty 1

## 2015-08-21 MED ORDER — APIXABAN 5 MG PO TABS: 5.0000 mg | ORAL_TABLET | Freq: Two times a day (BID) | ORAL | Status: AC

## 2015-08-21 MED ORDER — APIXABAN 5 MG PO TABS: 10.0000 mg | ORAL_TABLET | Freq: Two times a day (BID) | ORAL | Status: AC

## 2015-08-21 MED ORDER — FUROSEMIDE 10 MG/ML IJ SOLN
20.0000 mg | Freq: Once | INTRAMUSCULAR | Status: AC
  Administered 2015-08-21: 20 mg via INTRAVENOUS
  Filled 2015-08-21: qty 2

## 2015-08-21 MED ORDER — ASPIRIN EC 81 MG PO TBEC: 81.0000 mg | DELAYED_RELEASE_TABLET | Freq: Every day | ORAL | Status: AC

## 2015-08-21 MED ORDER — FUROSEMIDE 20 MG PO TABS: 20.0000 mg | ORAL_TABLET | Freq: Every day | ORAL | Status: AC

## 2015-08-21 MED ORDER — CIPROFLOXACIN HCL 250 MG PO TABS: 250.0000 mg | ORAL_TABLET | Freq: Two times a day (BID) | ORAL | Status: AC

## 2015-08-21 MED ORDER — DILTIAZEM HCL 60 MG PO TABS: 60.0000 mg | ORAL_TABLET | Freq: Four times a day (QID) | ORAL | Status: AC

## 2015-08-21 MED ORDER — CETYLPYRIDINIUM CHLORIDE 0.05 % MT LIQD: 7.0000 mL | Freq: Two times a day (BID) | OROMUCOSAL | Status: AC

## 2015-08-21 NOTE — Progress Notes (Signed)
Follow up visit made on new referral for hospice services at home. Patient seen sitting up in bed, alert, caregiver Okey Regal present at bedside. Per Okey Regal, bed has been delivered to the home, [facility] Misty Stanley and CSW Gavin Pound made aware. Both also aware that patient will need EMS transport at discharge.  Per conversation with staff RN Brandi patient currently has an order for a venous doppler study of her legs to be done prior to discharge. Plan at this time remains for discharge home today per [facility]. Discharge order is in place, out of facility DNR on patient's chart. Hospice referral intake made aware of pending discharge. Thank you. Dayna Barker RN, BSN, [facility] [place], [facility] [phone] c

## 2015-08-21 NOTE — Discharge Summary (Signed)
[facility] - [place] at [facility]   PATIENT NAME: Kristina [name]    MR#:  [id number]  DATE OF BIRTH:  20-Aug-1920  DATE OF ADMISSION:  08/16/2015 ADMITTING PHYSICIAN: Gracelyn Nurse, MD  DATE OF DISCHARGE: No discharge date for patient encounter.  PRIMARY CARE PHYSICIAN: Sula Rumple, MD     ADMISSION DIAGNOSIS:  Weakness [R53.1] Atrial fibrillation, unspecified type (HCC) [I48.91]  DISCHARGE DIAGNOSIS:  Principal Problem:   Atrial fibrillation with RVR (HCC) Active Problems:   Pseudomonas urinary tract infection   Metabolic acidosis   Acute pulmonary edema (HCC)   Acute CHF (congestive heart failure) (HCC)   Leukocytosis   Encephalopathy, metabolic   Hypernatremia   Dysphagia   Pressure ulcer   Functional quadriplegia (HCC)   SECONDARY DIAGNOSIS:  History reviewed. No pertinent past medical history.  .pro HOSPITAL COURSE:   Patient is 81 year old Caucasian female with past medical history significant for history of stroke and aphasia who presented to the hospital after episode of choking and turning blue. On arrival to emergency room, she was noted to be in A. fib with heart rate of 170. She was given Cardizem IV and placed on IV drip. Her chest x-ray was negative for pneumonia, but was initially concerning for possible bronchitis, which was thought to be due to aspiration. Patient's urinalysis was remarkable for pyuria, concerning for urinary tract infection. Urine cultures came back positive for Pseudomonas sensitive to ciprofloxacin.  Because of inability to eat due to significant dysphagia, patient was managed on Cardizem intravenously for short period of time. She was seen by cardiologist and followed along. With conservative therapy, patient improved and was allowed to eat dysphagia diet by speech therapist. Since she remained tachycardic and somewhat short of breath. She underwent CT scan of her chest with IV contrast to rule out pulmonary  embolism which in fact was positive for pulmonary embolism. Patient was initiated on Eliquis for pulmonary embolism. She was felt to be stable to be discharged home with hospice today,  6th of October 2016 Discussion by problem 1. Atrial fibrillation with rapid ventricle response, continue short-acting oral Cardizem every 6  hours. Continue Eliquis  , which he is being loaded according to pulmonary embolism protocol 2. Aspiration bronchitis, no obvious pneumonia on CT scan , patient is afebrile and her white blood cell count is normal, no need to treat 3. Dysphagia. Speech therapist evaluated patient and recommended dysphagia diet . Improving oral input 4. Urinary tract infection due to Pseudomonas. Continue patient on ciprofloxacin to complete 10 day course 5. Leukocytosis. Resolved with therapy 6. Metabolic acidosis likely due to poor perfusion with A. fib, RVR, also infection, resolved with therapy 7. History of stroke. Continue patient on aspirin, orally, CT scan of head showed no acute changes 8. Metabolic Encephalopathy, suspected due to urinary tract infection , A. fib, RVR. Patient seemed to be more interactive overall. Discharge to home with hospice services today  9. Pressure ulcers bilateral trochanter. Stage I, she was seen by Clydie Braun and recommended pink silicone foam daily changes as well as Prevalon boots. Unfortunately patient's oral intake is very poor and it's unlikely that patient's wounds are going to heal, palliative care consultation was requested, appreciated. Patient will be discharged home with hospice services 10. Hypernatremia. Was given some D5 water, sodium level has normalized 11. Pulmonary embolism. Continue loading Eliquis at 10 g twice daily dose for 7 day course and then continue at 5 mg twice daily dose for at least  6-12 months, Doppler ultrasound was ordered, however, not performed while in the hospital Management plans discussed with the patient, family and they are  in agreement DISCHARGE CONDITIONS:   Stable  CONSULTS OBTAINED:  Treatment Team:  Lamar Blinks, MD  DRUG ALLERGIES:  Not on File  DISCHARGE MEDICATIONS:   Current Discharge Medication List    START taking these medications   Details  antiseptic oral rinse (CPC / CETYLPYRIDINIUM CHLORIDE 0.05%) 0.05 % LIQD solution 7 mLs by Mouth Rinse route 2 times daily at 12 noon and 4 pm. Qty: 354 mL, Refills: 4    !! apixaban (ELIQUIS) 5 MG TABS tablet Take 2 tablets (10 mg total) by mouth 2 (two) times daily. Qty: 14 tablet, Refills: 0    !! apixaban (ELIQUIS) 5 MG TABS tablet Take 1 tablet (5 mg total) by mouth 2 (two) times daily. Qty: 60 tablet, Refills: 6    aspirin EC 81 MG tablet Take 1 tablet (81 mg total) by mouth daily. Qty: 30 tablet, Refills: 0    ciprofloxacin (CIPRO) 250 MG tablet Take 1 tablet (250 mg total) by mouth 2 (two) times daily. Qty: 20 tablet, Refills: 0    diltiazem (CARDIZEM) 60 MG tablet Take 1 tablet (60 mg total) by mouth 4 (four) times daily. Qty: 120 tablet, Refills: 6     !! - Potential duplicate medications found. Please discuss with provider.    CONTINUE these medications which have CHANGED   Details  furosemide (LASIX) 20 MG tablet Take 1 tablet (20 mg total) by mouth daily. Qty: 30 tablet, Refills: 6      STOP taking these medications     diltiazem (DILACOR XR) 180 MG 24 hr capsule      potassium chloride SA (K-DUR,KLOR-CON) 20 MEQ tablet          DISCHARGE INSTRUCTIONS:    Patient is to follow-up with her primary care physician, Dr. Elmer Ramp within 1 week after discharge. Patient will be followed by hospice at home  If you experience worsening of your admission symptoms, develop shortness of breath, life threatening emergency, suicidal or homicidal thoughts you must seek medical attention immediately by calling 911 or calling your MD immediately  if symptoms less severe.  You Must read complete instructions/literature along with  all the possible adverse reactions/side effects for all the Medicines you take and that have been prescribed to you. Take any new Medicines after you have completely understood and accept all the possible adverse reactions/side effects.   Please note  You were cared for by a hospitalist during your hospital stay. If you have any questions about your discharge medications or the care you received while you were in the hospital after you are discharged, you can call the unit and asked to speak with the hospitalist on call if the hospitalist that took care of you is not available. Once you are discharged, your primary care physician will handle any further medical issues. Please note that NO REFILLS for any discharge medications will be authorized once you are discharged, as it is imperative that you return to your primary care physician (or establish a relationship with a primary care physician if you do not have one) for your aftercare needs so that they can reassess your need for medications and monitor your lab values.    Today   CHIEF COMPLAINT:   Chief Complaint  Patient presents with  . Choking    HISTORY OF PRESENT ILLNESS:  Kristina [name]  is a  81 y.o. female with a known history of stroke and aphasia who presented to the hospital after episode of choking and turning blue. On arrival to emergency room, she was noted to be in A. fib with heart rate of 170. She was given Cardizem IV and placed on IV drip. Her chest x-ray was negative for pneumonia, but was initially concerning for possible bronchitis, which was thought to be due to aspiration. Patient's urinalysis was remarkable for pyuria, concerning for urinary tract infection. Urine cultures came back positive for Pseudomonas sensitive to ciprofloxacin.  Because of inability to eat due to significant dysphagia, patient was managed on Cardizem intravenously for short period of time. She was seen by cardiologist and followed along. With  conservative therapy, patient improved and was allowed to eat dysphagia diet by speech therapist. Since she remained tachycardic and somewhat short of breath. She underwent CT scan of her chest with IV contrast to rule out pulmonary embolism which in fact was positive for pulmonary embolism. Patient was initiated on Eliquis for pulmonary embolism. She was felt to be stable to be discharged home with hospice today,  6th of October 2016 Discussion by problem 1. Atrial fibrillation with rapid ventricle response, continue short-acting oral Cardizem every 6  hours. Continue Eliquis  , which he is being loaded according to pulmonary embolism protocol 2. Aspiration bronchitis, no obvious pneumonia on CT scan , patient is afebrile and her white blood cell count is normal, no need to treat 3. Dysphagia. Speech therapist evaluated patient and recommended dysphagia diet . Improving oral input 4. Urinary tract infection due to Pseudomonas. Continue patient on ciprofloxacin to complete 10 day course 5. Leukocytosis. Resolved with therapy 6. Metabolic acidosis likely due to poor perfusion with A. fib, RVR, also infection, resolved with therapy 7. History of stroke. Continue patient on aspirin, orally, CT scan of head showed no acute changes 8. Metabolic Encephalopathy, suspected due to urinary tract infection , A. fib, RVR. Patient seemed to be more interactive overall. Discharge to home with hospice services today  9. Pressure ulcers bilateral trochanter. Stage I, she was seen by Clydie Braun and recommended pink silicone foam daily changes as well as Prevalon boots. Unfortunately patient's oral intake is very poor and it's unlikely that patient's wounds are going to heal, palliative care consultation was requested, appreciated. Patient will be discharged home with hospice services 10. Hypernatremia. Was given some D5 water, sodium level has normalized 11. Pulmonary embolism. Continue loading Eliquis at 10 g twice daily  dose for 7 day course and then continue at 5 mg twice daily dose for at least 6-12 months, Doppler ultrasound was ordered, however, not performed while in the hospital Management plans discussed with the patient, family and they are in agreement    VITAL SIGNS:  Blood pressure 136/70, pulse 85, temperature 98 F (36.7 C), temperature source Oral, resp. rate 20, height 5\' 8"  (1.727 m), weight 68.8 kg (151 lb 10.8 oz), SpO2 94 %.  I/O:   Intake/Output Summary (Last 24 hours) at 08/21/15 1603 Last data filed at 08/21/15 1233  Gross per 24 hour  Intake      0 ml  Output      0 ml  Net      0 ml    PHYSICAL EXAMINATION:  GENERAL:  81 y.o.-year-old patient lying in the bed with no acute distress.  EYES: Pupils equal, round, reactive to light and accommodation. No scleral icterus. Extraocular muscles intact.  HEENT: Head atraumatic, normocephalic. Oropharynx and  nasopharynx clear.  NECK:  Supple, no jugular venous distention. No thyroid enlargement, no tenderness.  LUNGS: Normal breath sounds bilaterally, no wheezing, rales,rhonchi or crepitation. No use of accessory muscles of respiration.  CARDIOVASCULAR: S1, S2 normal. No murmurs, rubs, or gallops.  ABDOMEN: Soft, non-tender, non-distended. Bowel sounds present. No organomegaly or mass.  EXTREMITIES: No pedal edema, cyanosis, or clubbing.  NEUROLOGIC: Cranial nerves II through XII are intact. Muscle strength 5/5 in all extremities. Sensation intact. Gait not checked.  PSYCHIATRIC: The patient is alert and oriented x 3.  SKIN: No obvious rash, lesion, or ulcer.   DATA REVIEW:   CBC  Recent Labs Lab 08/19/15 0754  WBC 8.0  HGB 11.8*  HCT 35.1  PLT 208    Chemistries   Recent Labs Lab 08/16/15 1021 08/19/15 0754  08/21/15 1316  NA 143 148*  < > 141  K 4.0 3.0*  < > 3.3*  CL 108 120*  < > 112*  CO2 27 22  < > 21*  GLUCOSE 134* 111*  < > 110*  BUN 22* 13  < > 7  CREATININE 0.97 0.72  < > 0.60  CALCIUM 8.8* 8.0*  < >  8.2*  MG 2.6* 2.1  --   --   AST 32  --   --   --   ALT 25  --   --   --   ALKPHOS 82  --   --   --   BILITOT 1.1  --   --   --   < > = values in this interval not displayed.  Cardiac Enzymes  Recent Labs Lab 08/16/15 1021  TROPONINI <0.03    Microbiology Results  Results for orders placed or performed during the hospital encounter of 08/16/15  Culture, blood (routine x 2)     Status: None   Collection Time: 08/16/15 10:21 AM  Result Value Ref Range Status   Specimen Description BLOOD RIGHT ARM  Final   Special Requests BOTTLES DRAWN AEROBIC AND ANAEROBIC 5CC  Final   Culture NO GROWTH 5 DAYS  Final   Report Status 08/21/2015 FINAL  Final  Culture, blood (routine x 2)     Status: None   Collection Time: 08/16/15 10:21 AM  Result Value Ref Range Status   Specimen Description BLOOD LEFT ARM  Final   Special Requests BOTTLES DRAWN AEROBIC AND ANAEROBIC 6CC  Final   Culture NO GROWTH 5 DAYS  Final   Report Status 08/21/2015 FINAL  Final  MRSA PCR Screening     Status: None   Collection Time: 08/16/15  3:47 PM  Result Value Ref Range Status   MRSA by PCR NEGATIVE NEGATIVE Final    Comment:        The GeneXpert MRSA Assay (FDA approved for NASAL specimens only), is one component of a comprehensive MRSA colonization surveillance program. It is not intended to diagnose MRSA infection nor to guide or monitor treatment for MRSA infections.   Urine culture     Status: None   Collection Time: 08/19/15 11:29 AM  Result Value Ref Range Status   Specimen Description URINE, CATHETERIZED  Final   Special Requests Normal  Final   Culture 50,000 COLONIES/mL PSEUDOMONAS AERUGINOSA  Final   Report Status 08/21/2015 FINAL  Final   Organism ID, Bacteria PSEUDOMONAS AERUGINOSA  Final      Susceptibility   Pseudomonas aeruginosa - MIC*    CEFTAZIDIME <=1 SENSITIVE Sensitive     CIPROFLOXACIN <=0.25  SENSITIVE Sensitive     GENTAMICIN <=1 SENSITIVE Sensitive     IMIPENEM 1 SENSITIVE  Sensitive     * 50,000 COLONIES/mL PSEUDOMONAS AERUGINOSA    RADIOLOGY:  Ct Angio Chest Pe W/cm &/or Wo Cm  08/20/2015   CLINICAL DATA:  Tachycardia and difficulty breathing. Recent choking sensation  EXAM: CT ANGIOGRAPHY CHEST WITH CONTRAST  TECHNIQUE: Multidetector CT imaging of the chest was performed using the standard protocol during bolus administration of intravenous contrast. Multiplanar CT image reconstructions and MIPs were obtained to evaluate the vascular anatomy.  CONTRAST:  75mL OMNIPAQUE IOHEXOL 350 MG/ML SOLN  COMPARISON:  Chest radiograph August 16, 2015  FINDINGS: There are multiple incompletely obstructing pulmonary emboli and left lower lobe pulmonary arteries. No more central pulmonary emboli are appreciable. The right ventricle to left ventricle diameter ratio is less than 0.9, not indicative of right heart strain. There is no appreciable thoracic aortic aneurysm or dissection. There is atherosclerotic change in the aorta. The visualized great vessels appear unremarkable.  There are moderate pleural effusions bilaterally with bibasilar atelectasis and consolidation. There is mild interstitial edema. Heart is enlarged. There is a small pericardial effusion. There are scattered foci of coronary artery calcification.  Thyroid appears somewhat inhomogeneous without dominant mass. There are multiple small mediastinal lymph nodes without frank adenopathy by size criteria.  Visualized upper abdominal structures are unremarkable except for a small granuloma in the spleen. There is degenerative change in the thoracic spine with diffuse idiopathic skeletal hyperostosis. Bones are osteoporotic. There are no blastic or lytic bone lesions.  Review of the MIP images confirms the above findings.  IMPRESSION: Several left lower lobe pulmonary emboli, incompletely obstructing. No more central pulmonary emboli.  Bilateral pleural effusions with interstitial edema and cardiomegaly. Bibasilar atelectasis/  consolidation. Evidence of a degree of congestive heart failure.  Small pericardial effusion.  Multiple small mediastinal lymph nodes without frank adenopathy.  Probable multinodular goiter without dominant mass.  Foci of coronary artery calcification noted.  Critical Value/emergent results were called by telephone at the time of interpretation on 08/20/2015 at 3:49 pm to Dr. Katharina Caper , who verbally acknowledged these results.   Electronically Signed   By: Bretta Bang III M.D.   On: 08/20/2015 15:49    EKG:   Orders placed or performed during the hospital encounter of 08/16/15  . ED EKG  . ED EKG      Management plans discussed with the patient, family and they are in agreement.  CODE STATUS:     Code Status Orders        Start     Ordered   08/16/15 1425  Do not attempt resuscitation (DNR)   Continuous    Question Answer Comment  In the event of cardiac or respiratory ARREST Do not call a "code blue"   In the event of cardiac or respiratory ARREST Do not perform Intubation, CPR, defibrillation or ACLS   In the event of cardiac or respiratory ARREST Use medication by any route, position, wound care, and other measures to relive pain and suffering. May use oxygen, suction and manual treatment of airway obstruction as needed for comfort.      08/16/15 1424    Advance Directive Documentation        Most Recent Value   Type of Advance Directive  Living will   Pre-existing out of facility DNR order (yellow form or pink MOST form)     "MOST" Form in Place?  TOTAL TIME TAKING CARE OF THIS PATIENT: 45 minutes.  Discussed with caregiver  Maylin Freeburg M.D on 08/21/2015 at 4:03 PM  Between 7am to 6pm - Pager - [phone]  After 6pm go to [email or web address] - password [email or web address] [facility]  [facility]  Office  [phone]  CC: Primary care physician; Sula Rumple, MD

## 2015-08-21 NOTE — Progress Notes (Signed)
Patient to be d/c'd home. Education provided, no questions at this time. Patient to be picked up by EMS.  [name]

## 2015-08-21 NOTE — Progress Notes (Signed)
Spoke with Stephanie, [facility] rep at [phone], to notify of non-emergent EMS transport.  Auth notification reference given as [id number].   Service date range good from 08/21/15 - 11/19/15.   Gap exception requested to determine if services can be considered at an in-network level.

## 2015-08-22 NOTE — Progress Notes (Signed)
Family member, Lillith Mcneff, called to the floor this AM with report that patient did not discharge with her AVS. Patients son wanted to know what prescriptions patient was discharged on and what pharmacy they were sent to. This RN, unfamiliar with prior care of this patient, reviewed AVS with patients son via telephone and gave him names of patients prescriptions and the pharmacy they were sent to. RN also reviewed patients follow up appointments with Mr. Wendel; Mr. Hendricksen verbalized understanding of appointments, medications and location of pharmacy. RN encouraged Mr. Kina to call back if he has any more questions.

## 2015-08-22 NOTE — Care Management Note (Signed)
Case Management Note  Patient Details  Name: KATHEEN ASLIN MRN: [id number] Date of Birth: 08-11-1920  Subjective/Objective:   Late entry: TC to [phone]), requested prior authorization (urgent) for Eliquis. Spoke with Tresa Endo at [employer] regarding initiation of prior [place]. There is 24 hour time for [facility] response.                Action/Plan:   Expected Discharge Date:                  Expected Discharge Plan:  Home w Hospice Care  In-House Referral:     Discharge planning Services  CM Consult  Post Acute Care Choice:  Hospice Choice offered to:     DME Arranged:    DME Agency:     HH Arranged:    HH Agency:     Status of Service:  In process, will continue to follow  Medicare Important Message Given:  Yes-second notification given Date Medicare IM Given:    Medicare IM give by:    Date Additional Medicare IM Given:    Additional Medicare Important Message give by:     If discussed at Long Length of Stay Meetings, dates discussed:    Additional Comments:  Marily Memos, RN 08/22/2015, 10:29 AM

## 2015-08-22 NOTE — Progress Notes (Deleted)
Per Telemetry clerk, patients HR increased to 150s-160s, RN rounded on patient to assess, patient sitting quietly in recliner, no extemporaneous movement. MD notified and telemetry rechecked, patients HR now in 90s-100s. Per Dr. Wieting, give propanolol this morning.  

## 2015-08-22 NOTE — Progress Notes (Signed)
Patients pharmacy called up to the floor to speak with RN. RN verified that prescriptions had arrived to that pharmacy and this is affirmative. Pharmacy had questions regarding patients eliquis prescription and insurance covereage, RN had pharmacist speak with care management as they would be able to answer questions better. Handed off to CM.

## 2015-10-16 DEATH — deceased

## 2016-02-16 IMAGING — CT CT HEAD W/O CM
2 of 3 series · 16 of 30 positions shown, 18 images · non-contrast
Comparison: CT head dated 08/04/2012.

CLINICAL DATA: [age] old female with past medical history of
stroke and aphasia presenting today with choking.

EXAM:
CT HEAD WITHOUT CONTRAST
TECHNIQUE: Contiguous axial images were obtained from the base of the skull
through the vertex without intravenous contrast.

[Series 2: soft tissue · axial · 0.43mm/px · z∈[+312,+432]mm · 8 of 32 slices shown, 10 images]
[im 4/32  brain]
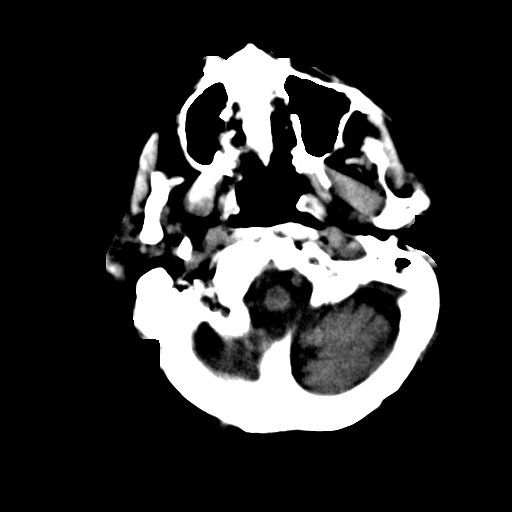
[im 4/32  bone]
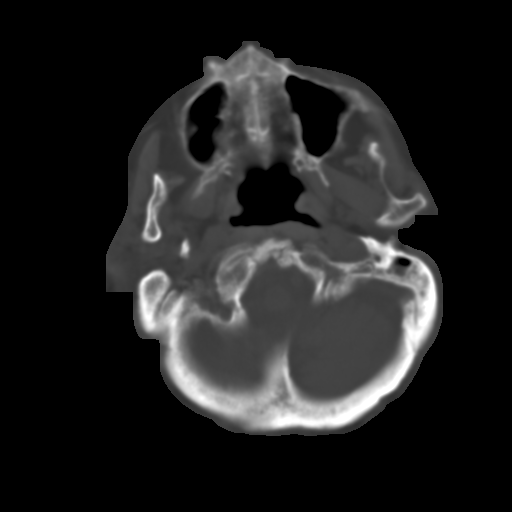
[im 7/32  brain]
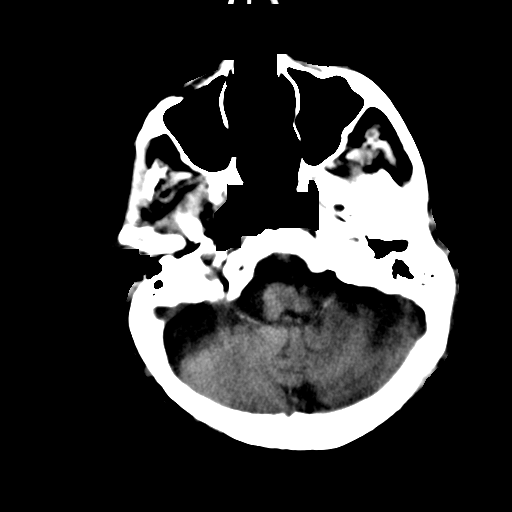
[im 11/32  brain]
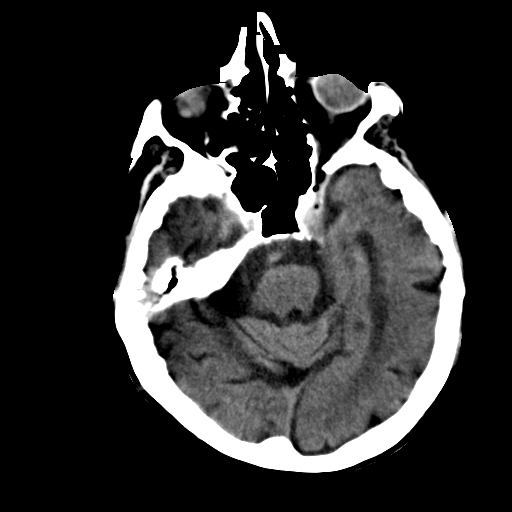
[im 14/32  brain]
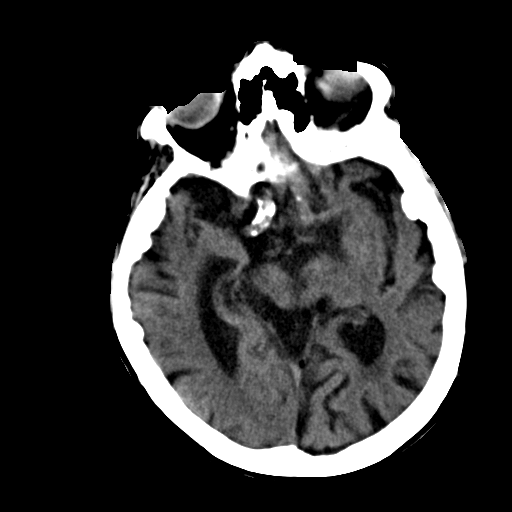
[im 18/32  brain]
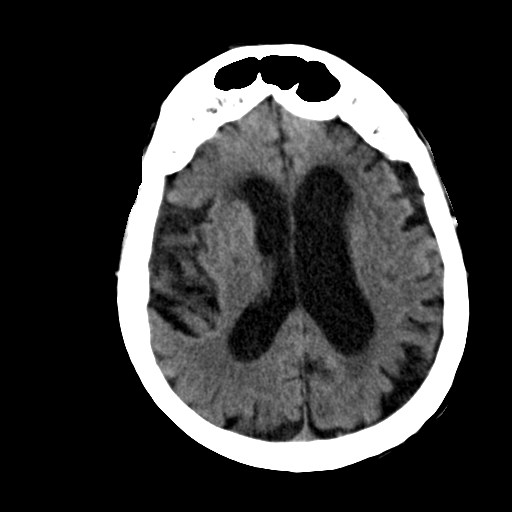
[im 18/32  bone]
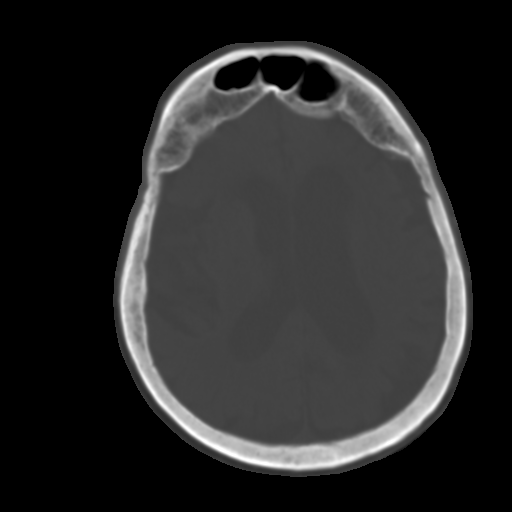
[im 21/32  brain]
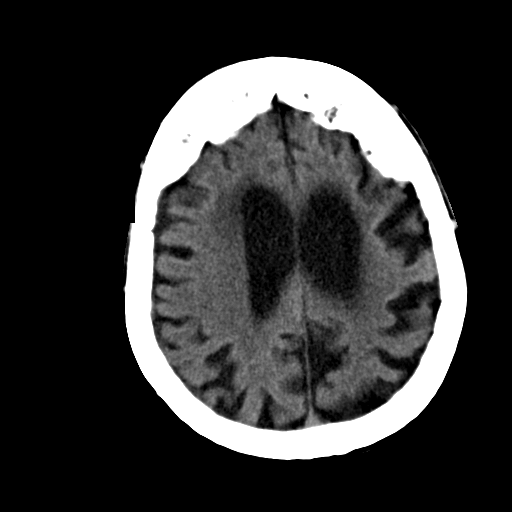
[im 25/32  brain]
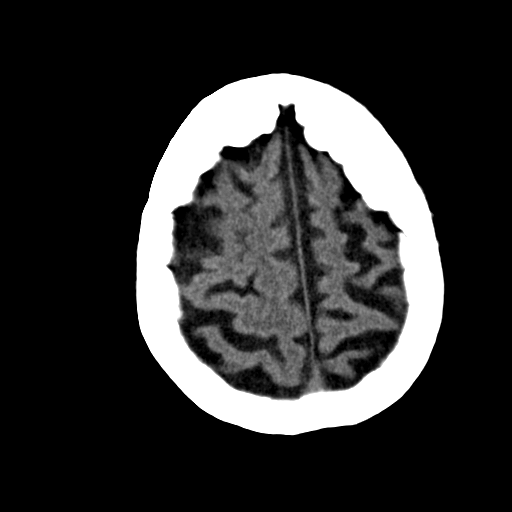
[im 28/32  brain]
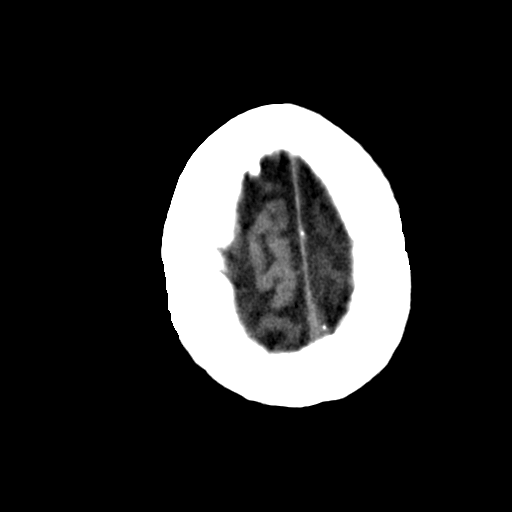

[Series 4: soft tissue recon · axial · 0.42mm/px · z∈[+381,+491]mm · 8 of 32 slices shown]
[im 4/32  brain]
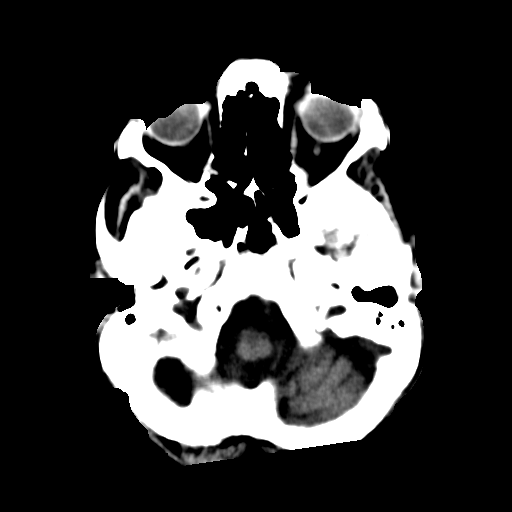
[im 7/32  brain]
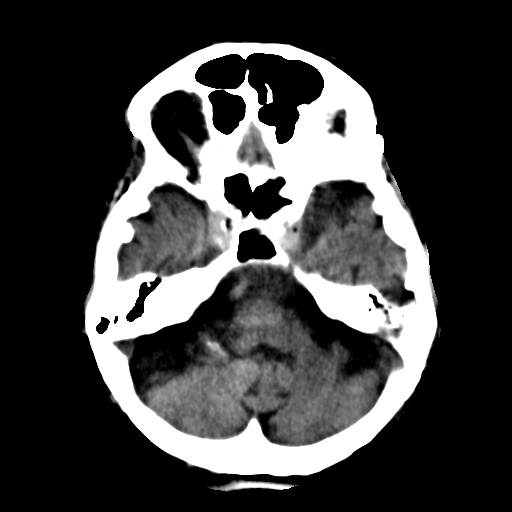
[im 11/32  brain]
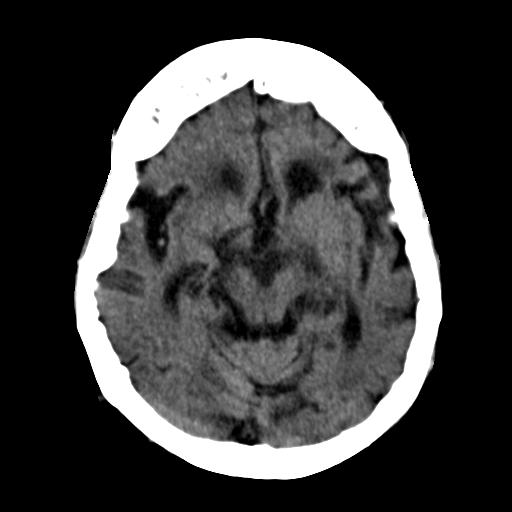
[im 14/32  brain]
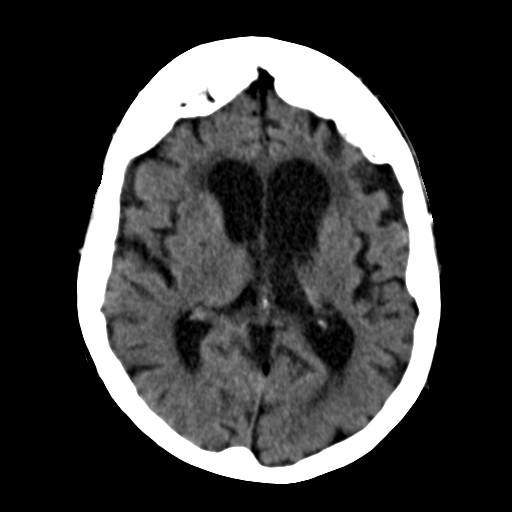
[im 18/32  brain]
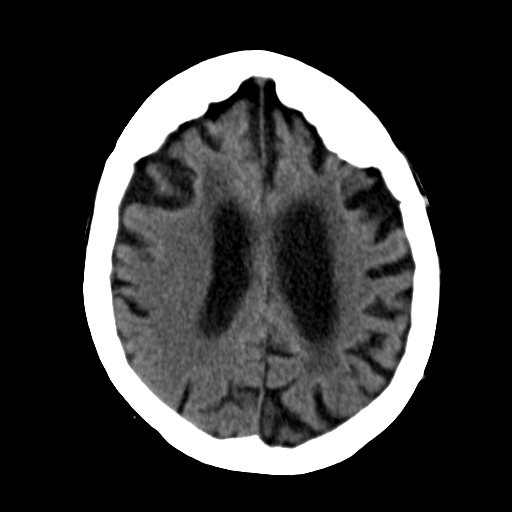
[im 21/32  brain]
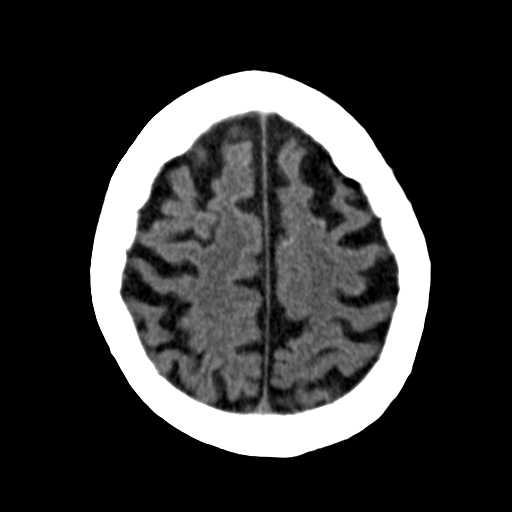
[im 25/32  brain]
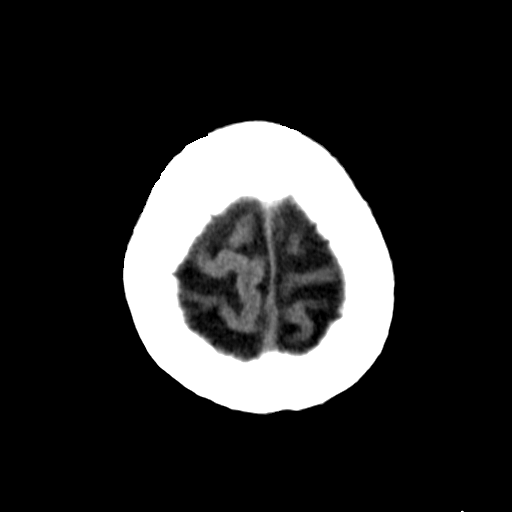
[im 28/32  brain]
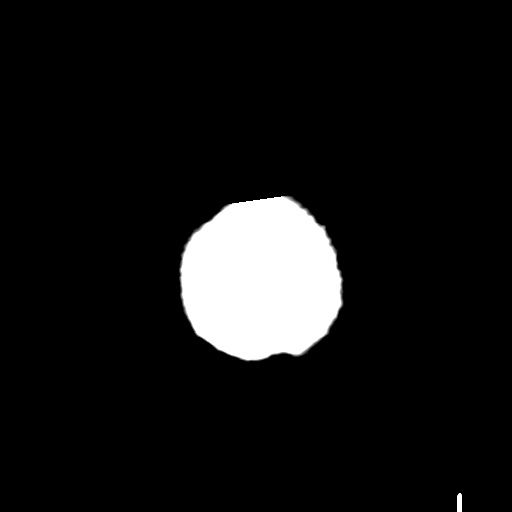

[16 of 30 positions shown; findings below may reference images not displayed]

FINDINGS: Again noted is generalized brain atrophy with commensurate
dilatation of the ventricles and sulci. The chronic small vessel
ischemic changes within the periventricular and subcortical white
matter appear stable. Old lacunar infarcts are noted within the
basal ganglia. Again noted are chronic calcified atherosclerotic
changes of the large vessels at the skull base.

There is no mass, hemorrhage, edema, or other evidence of acute
parenchymal abnormality. No extra-axial hemorrhage.

Paranasal sinuses are clear. No acute osseous abnormality
identified. Superficial soft tissues are unremarkable.
IMPRESSION: 1. Atrophy and chronic ischemic changes as detailed above.
2. No evidence of acute intracranial abnormality. No intracranial
mass, hemorrhage, or edema.
